# Patient Record
Sex: Female | Born: 1960 | Race: White | Hispanic: No | Smoking: Current some day smoker
Health system: Southern US, Community
[De-identification: ages and names within clinical notes are randomized; demographics above are authoritative.]

## PROBLEM LIST (undated history)

## (undated) DIAGNOSIS — T7840XA Allergy, unspecified, initial encounter: Secondary | ICD-10-CM

## (undated) DIAGNOSIS — E119 Type 2 diabetes mellitus without complications: Secondary | ICD-10-CM

## (undated) DIAGNOSIS — I10 Essential (primary) hypertension: Secondary | ICD-10-CM

## (undated) DIAGNOSIS — M1990 Unspecified osteoarthritis, unspecified site: Secondary | ICD-10-CM

## (undated) DIAGNOSIS — E785 Hyperlipidemia, unspecified: Secondary | ICD-10-CM

## (undated) HISTORY — DX: Allergy, unspecified, initial encounter: T78.40XA

## (undated) HISTORY — DX: Essential (primary) hypertension: I10

## (undated) HISTORY — DX: Unspecified osteoarthritis, unspecified site: M19.90

## (undated) HISTORY — DX: Type 2 diabetes mellitus without complications: E11.9

## (undated) HISTORY — PX: ABDOMINAL HYSTERECTOMY: SHX81

## (undated) HISTORY — DX: Hyperlipidemia, unspecified: E78.5

---

## 2017-03-04 LAB — HM DIABETES EYE EXAM

## 2017-12-13 ENCOUNTER — Ambulatory Visit: Payer: BLUE CROSS/BLUE SHIELD | Admitting: Physician Assistant

## 2017-12-13 ENCOUNTER — Encounter: Payer: Self-pay | Admitting: Physician Assistant

## 2017-12-13 DIAGNOSIS — I10 Essential (primary) hypertension: Secondary | ICD-10-CM | POA: Diagnosis not present

## 2017-12-13 DIAGNOSIS — E1100 Type 2 diabetes mellitus with hyperosmolarity without nonketotic hyperglycemic-hyperosmolar coma (NKHHC): Secondary | ICD-10-CM

## 2017-12-13 DIAGNOSIS — I152 Hypertension secondary to endocrine disorders: Secondary | ICD-10-CM

## 2017-12-13 DIAGNOSIS — Z1159 Encounter for screening for other viral diseases: Secondary | ICD-10-CM | POA: Diagnosis not present

## 2017-12-13 DIAGNOSIS — E1159 Type 2 diabetes mellitus with other circulatory complications: Secondary | ICD-10-CM

## 2017-12-13 DIAGNOSIS — Z130 Encounter for screening for diseases of the blood and blood-forming organs and certain disorders involving the immune mechanism: Secondary | ICD-10-CM | POA: Diagnosis not present

## 2017-12-13 DIAGNOSIS — Z114 Encounter for screening for human immunodeficiency virus [HIV]: Secondary | ICD-10-CM

## 2017-12-13 DIAGNOSIS — Z1329 Encounter for screening for other suspected endocrine disorder: Secondary | ICD-10-CM | POA: Diagnosis not present

## 2017-12-13 DIAGNOSIS — E785 Hyperlipidemia, unspecified: Secondary | ICD-10-CM

## 2017-12-13 MED ORDER — METFORMIN HCL 500 MG PO TABS
ORAL_TABLET | ORAL | 0 refills | Status: DC
Start: 2017-12-13 — End: 2018-02-01

## 2017-12-13 MED ORDER — GLYBURIDE 5 MG PO TABS: 5.0000 mg | ORAL_TABLET | Freq: Every day | ORAL | 0 refills | Status: DC

## 2017-12-13 MED ORDER — LISINOPRIL 10 MG PO TABS: 10.0000 mg | ORAL_TABLET | Freq: Every day | ORAL | 0 refills | Status: DC

## 2017-12-13 MED ORDER — AMLODIPINE BESYLATE 5 MG PO TABS: 5.0000 mg | ORAL_TABLET | Freq: Every day | ORAL | 0 refills | Status: DC

## 2017-12-13 NOTE — Progress Notes (Signed)
Patient: Stacie Fleming Female    DOB: 17-Apr-1961   57 y.o.   MRN: [id number] Visit Date: 12/14/2017  Today's Provider: Trinna Post, PA-C   Chief Complaint  Patient presents with  . Establish Care  . Diabetes  . Hypertension  . Hyperlipidemia   Subjective:    Stacie Fleming is a 57 y/o woman presenting today to establish care. She is moving from [place], [place]. She says she has lived here previously but moved to [place] to be with her daughter after the birth of her grandchild. She is living in [place], has a farm with a horse, dogs, planning on chickens. 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  She has two daughters, ages 65 and 22. She had a child who died shortly after birth due to polycystic kidney disease and immature lungs. She has two grandchildren.  She smokes half a pack a day.  She is divorced and not currently in a relationship or sexually active. She reports she had a hysterectomy at age 73 for some abnormal bleeding. Her uterus, cervix, and left ovary were removed. There was no cancer found.   She currently smokes 0.5 packs daily. She quit previously when pregnant. She has a desire to quit. Allergic to adhesives on patches.  She has a history of DM II, HLD, and HTN. She reports she was on 1000 mg metformin BID and glyburide 5 mg QD with a meal. She has been out of these for three months. Her last eye exam was at [facility] in [place]. She will need foot exam and urine microalbumin since she has been off Lisinopril. She reports she has never had pneumonia vaccine.  She was also on 20 mg Lisinopril daily and unknown dose of amlodipine. Has been out of these for three months as well. Denies chest pain, blurred vision.   Additionally was on crestor 20 mg QD, hasn't taken in months.    Hyperlipidemia  Pertinent negatives include no chest pain or shortness of breath.  Hypertension  This is a chronic problem. The problem  has been gradually worsening since onset. The problem is uncontrolled (170-180/ 90-100's). Associated symptoms include headaches. Pertinent negatives include no anxiety, blurred vision, chest pain, malaise/fatigue, neck pain, orthopnea, palpitations, peripheral edema, PND, shortness of breath or sweats. There are no associated agents to hypertension.  Diabetes  She presents for her follow-up diabetic visit. She has type 2 diabetes mellitus. Hypoglycemia symptoms include headaches. Pertinent negatives for hypoglycemia include no dizziness or sweats. Pertinent negatives for diabetes include no blurred vision and no chest pain. Current diabetic treatment includes oral agent (dual therapy). She is compliant with treatment none of the time. She is following a diabetic diet. She never participates in exercise. Her home blood glucose trend is increasing steadily (Since running out of medication. ). (Fasting: 130's  Not-fasting: High 100's - low 200's ) An ACE inhibitor/angiotensin II receptor blocker is not being taken. She does not see a podiatrist.Eye exam is current (Last eye exam 02/2017).       Allergies not on file   Current Outpatient Medications:  .  rosuvastatin (CRESTOR) 20 MG tablet, Take 1 tablet (20 mg total) by mouth daily., Disp: 90 tablet, Rfl: 0 .  amLODipine (NORVASC) 5 MG tablet, Take 1 tablet (5 mg total) by mouth daily., Disp: 90 tablet, Rfl: 0 .  glyBURIDE (DIABETA) 5 MG tablet, Take 1 tablet (5  mg total) by mouth daily with breakfast., Disp: 90 tablet, Rfl: 0 .  lisinopril (PRINIVIL,ZESTRIL) 10 MG tablet, Take 1 tablet (10 mg total) by mouth daily., Disp: 90 tablet, Rfl: 0 .  metFORMIN (GLUCOPHAGE) 500 MG tablet, Take 500 mg qd week 1. 500 mg BID week 2. 1000 mg Qam and 500 mg QHS week 3. 1000 mg BID week 4., Disp: 180 tablet, Rfl: 0  Review of Systems  Constitutional: Negative for malaise/fatigue.  HENT: Positive for postnasal drip.   Eyes: Negative for blurred vision.    Respiratory: Positive for cough. Negative for apnea, choking, chest tightness, shortness of breath, wheezing and stridor.   Cardiovascular: Negative for chest pain, palpitations, orthopnea and PND.  Gastrointestinal: Negative.   Musculoskeletal: Negative for neck pain.  Neurological: Positive for headaches. Negative for dizziness and light-headedness.    Social History   Tobacco Use  . Smoking status: Current Every Day Smoker    Packs/day: 0.50    Years: 40.00    Pack years: 20.00    Types: Cigarettes  . Smokeless tobacco: Never Used  Substance Use Topics  . Alcohol use: No    Frequency: Never   Objective:   There were no vitals taken for this visit. There were no vitals filed for this visit.   Physical Exam  Constitutional: She is oriented to person, place, and time. She appears well-developed and well-nourished.  Cardiovascular: Normal rate and regular rhythm.  Pulmonary/Chest: Effort normal and breath sounds normal.  Abdominal: Soft. Bowel sounds are normal.  Neurological: She is alert and oriented to person, place, and time.  Skin: Skin is warm and dry.  Psychiatric: She has a normal mood and affect. Her behavior is normal.        Assessment & Plan:     1. Type 2 diabetes mellitus with hyperosmolarity without coma, unspecified whether long term insulin use (HCC)  Uncontrolled, A1c today is 10.3. Clinical microalbuminuria present. She should restart her medications as instructed and should check her fasting blood sugars at least 4 days per week so I can trend this. Will request previous PCP records and eye exam. Have referred her locally to [facility] for yearly diabetic eye exam. She will need to follow up in one month to check sugars and also for physical exam/maintenance care.  - Urine Microalbumin w/creat. ratio - glyBURIDE (DIABETA) 5 MG tablet; Take 1 tablet (5 mg total) by mouth daily with breakfast.  Dispense: 90 tablet; Refill: 0 - metFORMIN (GLUCOPHAGE)  500 MG tablet; Take 500 mg qd week 1. 500 mg BID week 2. 1000 mg Qam and 500 mg QHS week 3. 1000 mg BID week 4.  Dispense: 180 tablet; Refill: 0 - Comprehensive Metabolic Panel (CMET) - Lipid Profile - Ambulatory referral to Ophthalmology - POCT HgB A1C  2. Hyperlipidemia, unspecified hyperlipidemia type  - rosuvastatin (CRESTOR) 20 MG tablet; Take 1 tablet (20 mg total) by mouth daily.  Dispense: 90 tablet; Refill: 0  3. Hypertension associated with diabetes ([place])  - lisinopril (PRINIVIL,ZESTRIL) 10 MG tablet; Take 1 tablet (10 mg total) by mouth daily.  Dispense: 90 tablet; Refill: 0 - amLODipine (NORVASC) 5 MG tablet; Take 1 tablet (5 mg total) by mouth daily.  Dispense: 90 tablet; Refill: 0  4. Screening for deficiency anemia  - CBC With Differential  5. Thyroid disorder screening - TSH  6. Encounter for screening for HIV  - HIV antibody (with reflex)  7. Encounter for hepatitis C screening test for low risk  patient - Hepatitis c antibody (reflex)  Return in about 1 month (around 01/10/2018) for CPE and chronic.  The entirety of the information documented in the History of Present Illness, Review of Systems and Physical Exam were personally obtained by me. Portions of this information were initially documented by Ashley Royalty, CMA and reviewed by me for thoroughness and accuracy.         Trinna Post, PA-C  [facility]

## 2017-12-13 NOTE — Patient Instructions (Signed)
Start with 500 mg once daily for one week. Add a 500 mg pill each week: Week 1: 500 mg QD Week 2: 500 mg BID Week 3: 1000 mg QAM and 500 mgs QHS Week 4: 1000 mg BID metformin  On week 2, add back 5 mg glyburide  HYPERTENSION Lisinopril 10 mg daily x 1 week  20 mg daily starting week 2 Week 2, add back 5 mg Norvasc  Check fasting Sugars: at least 4 days a week      Diabetes Mellitus and Exercise Exercising regularly is important for your overall health, especially when you have diabetes (diabetes mellitus). Exercising is not only about losing weight. It has many health benefits, such as increasing muscle strength and bone density and reducing body fat and stress. This leads to improved fitness, flexibility, and endurance, all of which result in better overall health. Exercise has additional benefits for people with diabetes, including:  Reducing appetite.  Helping to lower and control blood glucose.  Lowering blood pressure.  Helping to control amounts of fatty substances (lipids) in the blood, such as cholesterol and triglycerides.  Helping the body to respond better to insulin (improving insulin sensitivity).  Reducing how much insulin the body needs.  Decreasing the risk for heart disease by: ? Lowering cholesterol and triglyceride levels. ? Increasing the levels of good cholesterol. ? Lowering blood glucose levels.  What is my activity plan? Your health care provider or certified diabetes educator can help you make a plan for the type and frequency of exercise (activity plan) that works for you. Make sure that you:  Do at least 150 minutes of moderate-intensity or vigorous-intensity exercise each week. This could be brisk walking, biking, or water aerobics. ? Do stretching and strength exercises, such as yoga or weightlifting, at least 2 times a week. ? Spread out your activity over at least 3 days of the week.  Get some form of physical activity every day. ? Do not  go more than 2 days in a row without some kind of physical activity. ? Avoid being inactive for more than 90 minutes at a time. Take frequent breaks to walk or stretch.  Choose a type of exercise or activity that you enjoy, and set realistic goals.  Start slowly, and gradually increase the intensity of your exercise over time.  What do I need to know about managing my diabetes?  Check your blood glucose before and after exercising. ? If your blood glucose is higher than 240 mg/dL (13.3 mmol/L) before you exercise, check your urine for ketones. If you have ketones in your urine, do not exercise until your blood glucose returns to normal.  Know the symptoms of low blood glucose (hypoglycemia) and how to treat it. Your risk for hypoglycemia increases during and after exercise. Common symptoms of hypoglycemia can include: ? Hunger. ? Anxiety. ? Sweating and feeling clammy. ? Confusion. ? Dizziness or feeling light-headed. ? Increased heart rate or palpitations. ? Blurry vision. ? Tingling or numbness around the mouth, lips, or tongue. ? Tremors or shakes. ? Irritability.  Keep a rapid-acting carbohydrate snack available before, during, and after exercise to help prevent or treat hypoglycemia.  Avoid injecting insulin into areas of the body that are going to be exercised. For example, avoid injecting insulin into: ? The arms, when playing tennis. ? The legs, when jogging.  Keep records of your exercise habits. Doing this can help you and your health care provider adjust your diabetes management plan as needed.  Write down: ? Food that you eat before and after you exercise. ? Blood glucose levels before and after you exercise. ? The type and amount of exercise you have done. ? When your insulin is expected to peak, if you use insulin. Avoid exercising at times when your insulin is peaking.  When you start a new exercise or activity, work with your health care provider to make sure the  activity is safe for you, and to adjust your insulin, medicines, or food intake as needed.  Drink plenty of water while you exercise to prevent dehydration or heat stroke. Drink enough fluid to keep your urine clear or pale yellow. This information is not intended to replace advice given to you by your health care provider. Make sure you discuss any questions you have with your health care provider. Document Released: 12/26/2003 Document Revised: 04/24/2016 Document Reviewed: 03/16/2016 Elsevier Interactive Patient Education  2018 [employer].

## 2017-12-14 ENCOUNTER — Telehealth: Payer: Self-pay

## 2017-12-14 DIAGNOSIS — E1100 Type 2 diabetes mellitus with hyperosmolarity without nonketotic hyperglycemic-hyperosmolar coma (NKHHC): Secondary | ICD-10-CM | POA: Insufficient documentation

## 2017-12-14 DIAGNOSIS — I152 Hypertension secondary to endocrine disorders: Secondary | ICD-10-CM | POA: Insufficient documentation

## 2017-12-14 DIAGNOSIS — E785 Hyperlipidemia, unspecified: Secondary | ICD-10-CM | POA: Insufficient documentation

## 2017-12-14 DIAGNOSIS — E1159 Type 2 diabetes mellitus with other circulatory complications: Secondary | ICD-10-CM | POA: Insufficient documentation

## 2017-12-14 DIAGNOSIS — I10 Essential (primary) hypertension: Secondary | ICD-10-CM

## 2017-12-14 LAB — CBC WITH DIFFERENTIAL
Basophils Absolute: 0 10*3/uL (ref 0.0–0.2)
Basos: 0 %
EOS (ABSOLUTE): 0.4 10*3/uL (ref 0.0–0.4)
Eos: 3 %
Hematocrit: 47.8 % — ABNORMAL HIGH (ref 34.0–46.6)
Hemoglobin: 16.3 g/dL — ABNORMAL HIGH (ref 11.1–15.9)
Immature Grans (Abs): 0 10*3/uL (ref 0.0–0.1)
Immature Granulocytes: 0 %
Lymphocytes Absolute: 4 10*3/uL — ABNORMAL HIGH (ref 0.7–3.1)
Lymphs: 33 %
MCH: 29.8 pg (ref 26.6–33.0)
MCHC: 34.1 g/dL (ref 31.5–35.7)
MCV: 87 fL (ref 79–97)
Monocytes Absolute: 0.6 10*3/uL (ref 0.1–0.9)
Monocytes: 5 %
Neutrophils Absolute: 7.1 10*3/uL — ABNORMAL HIGH (ref 1.4–7.0)
Neutrophils: 59 %
RBC: 5.47 x10E6/uL — ABNORMAL HIGH (ref 3.77–5.28)
RDW: 13.4 % (ref 12.3–15.4)
WBC: 12.2 10*3/uL — ABNORMAL HIGH (ref 3.4–10.8)

## 2017-12-14 LAB — COMPREHENSIVE METABOLIC PANEL
ALT: 22 IU/L (ref 0–32)
AST: 16 IU/L (ref 0–40)
Albumin/Globulin Ratio: 1.6 (ref 1.2–2.2)
Albumin: 4.6 g/dL (ref 3.5–5.5)
Alkaline Phosphatase: 126 IU/L — ABNORMAL HIGH (ref 39–117)
BUN/Creatinine Ratio: 21 (ref 9–23)
BUN: 16 mg/dL (ref 6–24)
Bilirubin Total: 0.3 mg/dL (ref 0.0–1.2)
CO2: 21 mmol/L (ref 20–29)
Calcium: 10 mg/dL (ref 8.7–10.2)
Chloride: 97 mmol/L (ref 96–106)
Creatinine, Ser: 0.75 mg/dL (ref 0.57–1.00)
GFR calc Af Amer: 102 mL/min/{1.73_m2} (ref >59)
GFR calc non Af Amer: 89 mL/min/{1.73_m2} (ref >59)
Globulin, Total: 2.9 g/dL (ref 1.5–4.5)
Glucose: 298 mg/dL — ABNORMAL HIGH (ref 65–99)
Potassium: 4.4 mmol/L (ref 3.5–5.2)
Sodium: 134 mmol/L (ref 134–144)
Total Protein: 7.5 g/dL (ref 6.0–8.5)

## 2017-12-14 LAB — MICROALBUMIN / CREATININE URINE RATIO
Creatinine, Urine: 67.2 mg/dL
Microalb/Creat Ratio: 349.3 mg/g creat — ABNORMAL HIGH (ref 0.0–30.0)
Microalbumin, Urine: 234.7 ug/mL

## 2017-12-14 LAB — LIPID PANEL
Chol/HDL Ratio: 7.6 ratio — ABNORMAL HIGH (ref 0.0–4.4)
Cholesterol, Total: 267 mg/dL — ABNORMAL HIGH (ref 100–199)
HDL: 35 mg/dL — ABNORMAL LOW (ref >39)
Triglycerides: 563 mg/dL (ref 0–149)

## 2017-12-14 LAB — HCV COMMENT:

## 2017-12-14 LAB — TSH: TSH: 1.05 u[IU]/mL (ref 0.450–4.500)

## 2017-12-14 LAB — HIV ANTIBODY (ROUTINE TESTING W REFLEX): HIV Screen 4th Generation wRfx: NONREACTIVE

## 2017-12-14 LAB — HEPATITIS C ANTIBODY (REFLEX): HCV Ab: <0.1 s/co ratio (ref 0.0–0.9)

## 2017-12-14 LAB — POCT GLYCOSYLATED HEMOGLOBIN (HGB A1C): Hemoglobin A1C: 10.3

## 2017-12-14 MED ORDER — ROSUVASTATIN CALCIUM 20 MG PO TABS: 20.0000 mg | ORAL_TABLET | Freq: Every day | ORAL | 0 refills | Status: DC

## 2017-12-14 NOTE — Telephone Encounter (Signed)
-----   Message from Trinna Post, [place] sent at 12/14/2017  9:53 AM EST ----- Sugar high, as we expected since she's been out of her medications. Otherwise CMET normal. Cholesterol very high, LDL unable to be calculated. I will send in 20 mg crestor for her to resume nightly and we can recheck at her follow up. CBC with elevated white count, which patient has told me she has had for a while. Hemoglobin elevated, likely due to smoking. TSH normal. HIV normal. Hep C nonreactive.

## 2017-12-14 NOTE — Telephone Encounter (Signed)
Pt advised.   Thanks,   -Saraih Lorton  

## 2018-01-11 ENCOUNTER — Encounter: Payer: Self-pay | Admitting: Physician Assistant

## 2018-01-11 ENCOUNTER — Ambulatory Visit (INDEPENDENT_AMBULATORY_CARE_PROVIDER_SITE_OTHER): Payer: BLUE CROSS/BLUE SHIELD | Admitting: Physician Assistant

## 2018-01-11 VITALS — BP 144/94 | HR 80 | Temp 98.0°F | Resp 16 | Ht 65.0 in | Wt 186.0 lb

## 2018-01-11 DIAGNOSIS — E1159 Type 2 diabetes mellitus with other circulatory complications: Secondary | ICD-10-CM | POA: Diagnosis not present

## 2018-01-11 DIAGNOSIS — Z1211 Encounter for screening for malignant neoplasm of colon: Secondary | ICD-10-CM

## 2018-01-11 DIAGNOSIS — Z0000 Encounter for general adult medical examination without abnormal findings: Secondary | ICD-10-CM | POA: Diagnosis not present

## 2018-01-11 DIAGNOSIS — Z23 Encounter for immunization: Secondary | ICD-10-CM

## 2018-01-11 DIAGNOSIS — I10 Essential (primary) hypertension: Secondary | ICD-10-CM | POA: Diagnosis not present

## 2018-01-11 DIAGNOSIS — E1100 Type 2 diabetes mellitus with hyperosmolarity without nonketotic hyperglycemic-hyperosmolar coma (NKHHC): Secondary | ICD-10-CM | POA: Diagnosis not present

## 2018-01-11 DIAGNOSIS — I152 Hypertension secondary to endocrine disorders: Secondary | ICD-10-CM

## 2018-01-11 DIAGNOSIS — Z1239 Encounter for other screening for malignant neoplasm of breast: Secondary | ICD-10-CM

## 2018-01-11 MED ORDER — AMLODIPINE BESYLATE 10 MG PO TABS: 10.0000 mg | ORAL_TABLET | Freq: Every day | ORAL | 3 refills | Status: DC

## 2018-01-11 MED ORDER — ATORVASTATIN CALCIUM 10 MG PO TABS: 10.0000 mg | ORAL_TABLET | Freq: Every day | ORAL | 0 refills | Status: DC

## 2018-01-11 NOTE — Patient Instructions (Signed)

## 2018-01-11 NOTE — Progress Notes (Signed)
Patient: Stacie Fleming, Female    DOB: 14-Apr-1961, 57 y.o.   MRN: [id number] Visit Date: 01/11/2018  Today's Provider: Trinna Post, PA-C   Chief Complaint  Patient presents with  . Annual Exam   Subjective:    Annual physical exam Stacie Fleming is a 57 y.o. female who presents today for health maintenance and complete physical. She feels well. She reports exercising regularly. She reports she is sleeping well.  She was seen last visit as an establish care. She has chronic health issues including DM II, HTN, and HLD. Her A1C was 10.3. She was restarted on her diabetes medications including metformin and glyburide. Her fasting sugars have been running in the 100's most recently. She was also restarted on her Lisinopril 10 mg and amlodipine 5 mg. Crestor was sent in but she said it was too expensive for her insurance so she did not start this.  She cannot remember the last time she had a mammogram.  She had a hysterectomy remotely for non cancerous reasons and reports she does not have a cervix.   She has never had a colonoscopy. She will not get a colonoscopy. She does not have a family history of colon cancer. She is agreeable to the cologuard.  She is due for tetanus shot and pneumonia 23 today.  She has an upcoming eye exam scheduled.  -----------------------------------------------------------------   Review of Systems  Constitutional: Negative.   HENT: Negative.   Eyes: Negative.   Respiratory: Negative.   Cardiovascular: Negative.   Gastrointestinal: Negative.   Endocrine: Negative.   Genitourinary: Negative.   Musculoskeletal: Negative.   Skin: Negative.   Allergic/Immunologic: Negative.   Neurological: Negative.   Hematological: Negative.   Psychiatric/Behavioral: Negative.     Social History      She  reports that she has been smoking cigarettes.  She has a 20.00 pack-year smoking history. She has never used smokeless tobacco. She reports that  she does not drink alcohol or use drugs.       Social History   Socioeconomic History  . Marital status: Divorced    Spouse name: Not on file  . Number of children: Not on file  . Years of education: Not on file  . Highest education level: Not on file  Occupational History  . Not on file  Social Needs  . Financial resource strain: Not on file  . Food insecurity:    Worry: Not on file    Inability: Not on file  . Transportation needs:    Medical: Not on file    Non-medical: Not on file  Tobacco Use  . Smoking status: Current Every Day Smoker    Packs/day: 0.50    Years: 40.00    Pack years: 20.00    Types: Cigarettes  . Smokeless tobacco: Never Used  Substance and Sexual Activity  . Alcohol use: No    Frequency: Never  . Drug use: No  . Sexual activity: Not on file  Lifestyle  . Physical activity:    Days per week: Not on file    Minutes per session: Not on file  . Stress: Not on file  Relationships  . Social connections:    Talks on phone: Not on file    Gets together: Not on file    Attends religious service: Not on file    Active member of club or organization: Not on file    Attends meetings of clubs or organizations:  Not on file    Relationship status: Not on file  Other Topics Concern  . Not on file  Social History Narrative  . Not on file    History reviewed. No pertinent past medical history.   Patient Active Problem List   Diagnosis Date Noted  . Type 2 diabetes mellitus with hyperosmolarity without coma ([place]) 12/14/2017  . Hyperlipidemia 12/14/2017  . Hypertension associated with diabetes ([place]) 12/14/2017    Past Surgical History:  Procedure Laterality Date  . ABDOMINAL HYSTERECTOMY    . CESAREAN SECTION      Family History        Family Status  Relation Name Status  . Mother  Deceased  . Father  Deceased  . Sister  Deceased        Her family history includes Diabetes in her father, mother, and sister; Heart attack in her father and  mother; Hyperlipidemia in her father and mother; Hypothyroidism in her mother; Kidney disease in her sister.      Allergies  Allergen Reactions  . Neosporin [Neomycin-Bacitracin Zn-Polymyx]      Current Outpatient Medications:  .  amLODipine (NORVASC) 5 MG tablet, Take 1 tablet (5 mg total) by mouth daily., Disp: 90 tablet, Rfl: 0 .  glyBURIDE (DIABETA) 5 MG tablet, Take 1 tablet (5 mg total) by mouth daily with breakfast., Disp: 90 tablet, Rfl: 0 .  lisinopril (PRINIVIL,ZESTRIL) 10 MG tablet, Take 1 tablet (10 mg total) by mouth daily., Disp: 90 tablet, Rfl: 0 .  metFORMIN (GLUCOPHAGE) 500 MG tablet, Take 500 mg qd week 1. 500 mg BID week 2. 1000 mg Qam and 500 mg QHS week 3. 1000 mg BID week 4., Disp: 180 tablet, Rfl: 0 .  rosuvastatin (CRESTOR) 20 MG tablet, Take 1 tablet (20 mg total) by mouth daily. (Patient not taking: Reported on 01/11/2018), Disp: 90 tablet, Rfl: 0   Patient Care Team: Paulene Floor as PCP - General (Physician Assistant)      Objective:   Vitals: BP (!) 144/94 (BP Location: Right Arm, Patient Position: Sitting, Cuff Size: Normal)   Pulse 80   Temp 98 F (36.7 C) (Oral)   Resp 16   Ht 5\' 5"  (1.651 m)   Wt 186 lb (84.4 kg)   BMI 30.95 kg/m    Vitals:   01/11/18 0809  BP: (!) 144/94  Pulse: 80  Resp: 16  Temp: 98 F (36.7 C)  TempSrc: Oral  Weight: 186 lb (84.4 kg)  Height: 5\' 5"  (1.651 m)     Physical Exam  Constitutional: She is oriented to person, place, and time. She appears well-developed and well-nourished.  Cardiovascular: Normal rate and regular rhythm.  Pulmonary/Chest: Effort normal and breath sounds normal. Right breast exhibits no inverted nipple, no mass, no nipple discharge, no skin change and no tenderness. Left breast exhibits no inverted nipple, no mass, no nipple discharge, no skin change and no tenderness. Breasts are symmetrical.  Neurological: She is alert and oriented to person, place, and time.  Skin: Skin is warm  and dry.  Psychiatric: She has a normal mood and affect. Her behavior is normal.     Depression Screen PHQ 2/9 Scores 12/13/2017  PHQ - 2 Score 0  PHQ- 9 Score 0   Diabetic Foot Exam - Simple   Simple Foot Form Diabetic Foot exam was performed with the following findings:  Yes 01/11/2018  8:30 AM  Visual Inspection No deformities, no ulcerations, no other skin breakdown bilaterally:  Yes Sensation Testing Intact to touch and monofilament testing bilaterally:  Yes Pulse Check Comments       Assessment & Plan:     Routine Health Maintenance and Physical Exam  Exercise Activities and Dietary recommendations Goals    None       There is no immunization history on file for this patient.  Health Maintenance  Topic Date Due  . PNEUMOCOCCAL POLYSACCHARIDE VACCINE (1) 10/23/1962  . FOOT EXAM  10/23/1970  . TETANUS/TDAP  10/24/1979  . MAMMOGRAM  10/23/2010  . COLONOSCOPY  10/23/2010  . INFLUENZA VACCINE  05/19/2018 (Originally 05/19/2017)  . OPHTHALMOLOGY EXAM  03/04/2018  . HEMOGLOBIN A1C  06/13/2018  . Hepatitis C Screening  Completed  . HIV Screening  Completed     Discussed health benefits of physical activity, and encouraged her to engage in regular exercise appropriate for her age and condition.    1. Annual physical exam   2. Breast cancer screening  - MM Digital Screening; Future  3. Hypertension associated with diabetes ([place])  Increased from 5 mg daily to 10 mg daily 2/2 elevated BP today.   - amLODipine (NORVASC) 10 MG tablet; Take 1 tablet (10 mg total) by mouth daily.  Dispense: 90 tablet; Refill: 3  4. Colon cancer screening  - Cologuard  5. Type 2 diabetes mellitus with hyperosmolarity without coma, without long-term current use of insulin (HCC)  Will see her back in 3 months to check A1c.  - atorvastatin (LIPITOR) 10 MG tablet; Take 1 tablet (10 mg total) by mouth daily.  Dispense: 90 tablet; Refill: 0 - Pneumococcal polysaccharide vaccine  23-valent greater than or equal to 2yo subcutaneous/IM  6. Need for Tdap vaccination  - Tdap vaccine greater than or equal to 57yo IM  Return in about 3 months (around 04/13/2018) for DM, HTN, HLD.  The entirety of the information documented in the History of Present Illness, Review of Systems and Physical Exam were personally obtained by me. Portions of this information were initially documented by Ashley Royalty, CMA and reviewed by me for thoroughness and accuracy.   --------------------------------------------------------------------    Trinna Post, PA-C  [facility]

## 2018-01-12 ENCOUNTER — Telehealth: Payer: Self-pay | Admitting: Physician Assistant

## 2018-01-12 NOTE — Telephone Encounter (Signed)
Order for cologuard faxed to [facility] °

## 2018-02-01 ENCOUNTER — Other Ambulatory Visit: Payer: Self-pay | Admitting: Physician Assistant

## 2018-02-01 DIAGNOSIS — E1100 Type 2 diabetes mellitus with hyperosmolarity without nonketotic hyperglycemic-hyperosmolar coma (NKHHC): Secondary | ICD-10-CM

## 2018-02-01 DIAGNOSIS — E1159 Type 2 diabetes mellitus with other circulatory complications: Secondary | ICD-10-CM

## 2018-02-01 DIAGNOSIS — I152 Hypertension secondary to endocrine disorders: Secondary | ICD-10-CM

## 2018-02-01 DIAGNOSIS — I10 Essential (primary) hypertension: Secondary | ICD-10-CM

## 2018-02-01 MED ORDER — LISINOPRIL 10 MG PO TABS: 10.0000 mg | ORAL_TABLET | Freq: Every day | ORAL | 0 refills | Status: DC

## 2018-02-01 MED ORDER — METFORMIN HCL 1000 MG PO TABS: 1000.0000 mg | ORAL_TABLET | Freq: Two times a day (BID) | ORAL | 0 refills | Status: DC

## 2018-02-01 NOTE — Telephone Encounter (Signed)
Pt contacted office for refill request on the following medications:  1. metFORMIN (GLUCOPHAGE) 500 MG tablet  2. lisinopril (PRINIVIL,ZESTRIL) 10 MG tablet   90 day supply  [facility] [address]  Last Rx: 12/13/17 90 day supply LOV: 01/11/18 Please advise. Thanks TNP

## 2018-02-15 DIAGNOSIS — Z1211 Encounter for screening for malignant neoplasm of colon: Secondary | ICD-10-CM | POA: Diagnosis not present

## 2018-02-16 LAB — COLOGUARD: COLOGUARD: POSITIVE

## 2018-02-17 DIAGNOSIS — E119 Type 2 diabetes mellitus without complications: Secondary | ICD-10-CM | POA: Diagnosis not present

## 2018-02-17 LAB — HM DIABETES EYE EXAM

## 2018-02-18 ENCOUNTER — Encounter: Payer: Self-pay | Admitting: Physician Assistant

## 2018-02-28 ENCOUNTER — Telehealth: Payer: Self-pay | Admitting: Physician Assistant

## 2018-02-28 DIAGNOSIS — R195 Other fecal abnormalities: Secondary | ICD-10-CM | POA: Insufficient documentation

## 2018-02-28 NOTE — Telephone Encounter (Signed)
This patient has had a positive cologuard. She will need a colonoscopy to follow up. Please let me know if she prefers a particular GI doctor, if not, I will refer within [facility].

## 2018-03-01 ENCOUNTER — Other Ambulatory Visit: Payer: Self-pay | Admitting: Physician Assistant

## 2018-03-01 DIAGNOSIS — R195 Other fecal abnormalities: Secondary | ICD-10-CM

## 2018-03-01 NOTE — Telephone Encounter (Signed)
Referral to GI has been placed. 

## 2018-03-01 NOTE — Progress Notes (Signed)
Referral to GI placed for positive Cologuard.

## 2018-03-01 NOTE — Telephone Encounter (Signed)
Patient advised she has no G.I specialist in mind, please refer. KW

## 2018-03-17 ENCOUNTER — Encounter: Payer: Self-pay | Admitting: Gastroenterology

## 2018-04-13 ENCOUNTER — Ambulatory Visit: Payer: BLUE CROSS/BLUE SHIELD | Admitting: Physician Assistant

## 2018-04-13 ENCOUNTER — Encounter: Payer: Self-pay | Admitting: Physician Assistant

## 2018-04-13 VITALS — BP 122/76 | HR 76 | Temp 98.4°F | Resp 16 | Wt 182.0 lb

## 2018-04-13 DIAGNOSIS — E1159 Type 2 diabetes mellitus with other circulatory complications: Secondary | ICD-10-CM | POA: Diagnosis not present

## 2018-04-13 DIAGNOSIS — I10 Essential (primary) hypertension: Secondary | ICD-10-CM

## 2018-04-13 DIAGNOSIS — E1100 Type 2 diabetes mellitus with hyperosmolarity without nonketotic hyperglycemic-hyperosmolar coma (NKHHC): Secondary | ICD-10-CM

## 2018-04-13 DIAGNOSIS — E785 Hyperlipidemia, unspecified: Secondary | ICD-10-CM

## 2018-04-13 DIAGNOSIS — I152 Hypertension secondary to endocrine disorders: Secondary | ICD-10-CM

## 2018-04-13 LAB — POCT GLYCOSYLATED HEMOGLOBIN (HGB A1C): Hemoglobin A1C: 7.2 % — AB (ref 4.0–5.6)

## 2018-04-13 MED ORDER — GLYBURIDE 5 MG PO TABS: ORAL_TABLET | ORAL | 0 refills | Status: DC

## 2018-04-13 NOTE — Patient Instructions (Signed)

## 2018-04-13 NOTE — Progress Notes (Signed)
Patient: Stacie Fleming Female    DOB: [birth year]   57 y.o.   MRN: [id number] Visit Date: 04/13/2018  Today's Provider: Trinna Post, PA-C   Chief Complaint  Patient presents with  . Diabetes  . Hypertension  . Hyperlipidemia   Subjective:      Lab Results  Component Value Date   HGBA1C 7.2 (A) 04/13/2018    Diabetes  She presents for her follow-up diabetic visit. She has type 2 diabetes mellitus. Her disease course has been stable. There are no hypoglycemic associated symptoms. Pertinent negatives for hypoglycemia include no headaches or sweats. Pertinent negatives for diabetes include no blurred vision, no chest pain, no fatigue, no foot paresthesias, no foot ulcerations, no polydipsia, no polyphagia, no polyuria, no visual change, no weakness and no weight loss. Symptoms are stable. Current diabetic treatment includes oral agent (dual therapy). She is compliant with treatment all of the time. Her weight is stable. She is following a generally healthy diet. She participates in exercise three times a week. There is no change (Fasting blood sugars in the low 100's ) in her home blood glucose trend. She does not see a podiatrist.Eye exam is current.  Hypertension  The problem is controlled. Pertinent negatives include no anxiety, blurred vision, chest pain, headaches, malaise/fatigue, neck pain, orthopnea, palpitations, peripheral edema, PND, shortness of breath or sweats. There are no associated agents to hypertension. Risk factors for coronary artery disease include dyslipidemia and diabetes mellitus. Past treatments include ACE inhibitors and calcium channel blockers. There are no compliance problems.   Hyperlipidemia  This is a chronic problem. Pertinent negatives include no chest pain or shortness of breath. Current antihyperlipidemic treatment includes statins. There are no compliance problems.     Lab Results  Component Value Date   CHOL 267 (H) 12/13/2017   Lab  Results  Component Value Date   HDL 35 (L) 12/13/2017   Lab Results  Component Value Date   LDLCALC Comment 12/13/2017   Lab Results  Component Value Date   TRIG 563 ([place]) 12/13/2017   Lab Results  Component Value Date   CHOLHDL 7.6 (H) 12/13/2017   No results found for: LDLDIRECT   Allergies  Allergen Reactions  . Neosporin [Neomycin-Bacitracin Zn-Polymyx]      Current Outpatient Medications:  .  amLODipine (NORVASC) 10 MG tablet, Take 1 tablet (10 mg total) by mouth daily., Disp: 90 tablet, Rfl: 3 .  atorvastatin (LIPITOR) 10 MG tablet, Take 1 tablet (10 mg total) by mouth daily., Disp: 90 tablet, Rfl: 0 .  lisinopril (PRINIVIL,ZESTRIL) 10 MG tablet, Take 1 tablet (10 mg total) by mouth daily., Disp: 90 tablet, Rfl: 0 .  metFORMIN (GLUCOPHAGE) 1000 MG tablet, Take 1 tablet (1,000 mg total) by mouth 2 (two) times daily with a meal., Disp: 180 tablet, Rfl: 0 .  glyBURIDE (DIABETA) 5 MG tablet, Take 1 tablet (5 mg total) by mouth daily with breakfast., Disp: 90 tablet, Rfl: 0  Review of Systems  Constitutional: Negative for fatigue, malaise/fatigue and weight loss.  Eyes: Negative for blurred vision.  Respiratory: Negative for shortness of breath.   Cardiovascular: Negative for chest pain, palpitations, orthopnea and PND.  Endocrine: Negative for polydipsia, polyphagia and polyuria.  Musculoskeletal: Negative for neck pain.  Neurological: Negative for weakness and headaches.    Social History   Tobacco Use  . Smoking status: Current Every Day Smoker    Packs/day: 0.50    Years: 40.00  Pack years: 20.00    Types: Cigarettes  . Smokeless tobacco: Never Used  Substance Use Topics  . Alcohol use: No    Frequency: Never   Objective:   BP 122/76 (BP Location: Right Arm, Patient Position: Sitting, Cuff Size: Large)   Pulse 76   Temp 98.4 F (36.9 C) (Oral)   Resp 16   Wt 182 lb (82.6 kg)   BMI 30.29 kg/m  Vitals:   04/13/18 1010  BP: 122/76  Pulse: 76    Resp: 16  Temp: 98.4 F (36.9 C)  TempSrc: Oral  Weight: 182 lb (82.6 kg)     Physical Exam      Assessment & Plan:     1. Type 2 diabetes mellitus with hyperosmolarity without coma, unspecified whether long term insulin use (HCC)  A1c very much improved today, just shy of being controlled. Will increase glyburide to 5 mg in the morning and 2.5 mg at night. Patient reports she is renovating her house and she is eating poorly.  - POCT glycosylated hemoglobin (Hb A1C) - glyBURIDE (DIABETA) 5 MG tablet; Take 5 mg once in the morning and 2.5 mg once in the evening both with meals.  Dispense: 135 tablet; Refill: 0  2. Hyperlipidemia, unspecified hyperlipidemia type  Stable, continue current medications.   3. Hypertension associated with diabetes ([place])  Stable, continue current medications.  Return in about 3 months (around 07/14/2018) for DM.  The entirety of the information documented in the History of Present Illness, Review of Systems and Physical Exam were personally obtained by me. Portions of this information were initially documented by Ashley Royalty, CMA and reviewed by me for thoroughness and accuracy.          Trinna Post, PA-C  [facility]

## 2018-05-04 ENCOUNTER — Other Ambulatory Visit: Payer: Self-pay | Admitting: Physician Assistant

## 2018-05-04 DIAGNOSIS — I152 Hypertension secondary to endocrine disorders: Secondary | ICD-10-CM

## 2018-05-04 DIAGNOSIS — I10 Essential (primary) hypertension: Secondary | ICD-10-CM

## 2018-05-04 DIAGNOSIS — E1159 Type 2 diabetes mellitus with other circulatory complications: Secondary | ICD-10-CM

## 2018-05-04 DIAGNOSIS — E1100 Type 2 diabetes mellitus with hyperosmolarity without nonketotic hyperglycemic-hyperosmolar coma (NKHHC): Secondary | ICD-10-CM

## 2018-05-04 NOTE — Telephone Encounter (Signed)
Pt contacted office for refill request on the following medications:  1. metFORMIN (GLUCOPHAGE) 1000 MG tablet 2. lisinopril (PRINIVIL,ZESTRIL) 10 MG tablet 3. atorvastatin (LIPITOR) 10 MG tablet  [facility] [address]  LOV: 04/13/18 Please advise. Thanks TNP

## 2018-05-05 MED ORDER — LISINOPRIL 10 MG PO TABS: 10.0000 mg | ORAL_TABLET | Freq: Every day | ORAL | 0 refills | Status: DC

## 2018-05-05 MED ORDER — ATORVASTATIN CALCIUM 10 MG PO TABS: 10.0000 mg | ORAL_TABLET | Freq: Every day | ORAL | 0 refills | Status: DC

## 2018-05-05 MED ORDER — METFORMIN HCL 1000 MG PO TABS: 1000.0000 mg | ORAL_TABLET | Freq: Two times a day (BID) | ORAL | 0 refills | Status: DC

## 2018-05-10 ENCOUNTER — Ambulatory Visit (AMBULATORY_SURGERY_CENTER): Payer: Self-pay

## 2018-05-10 VITALS — Ht 65.0 in | Wt 182.4 lb

## 2018-05-10 DIAGNOSIS — Z1211 Encounter for screening for malignant neoplasm of colon: Secondary | ICD-10-CM

## 2018-05-10 MED ORDER — NA SULFATE-K SULFATE-MG SULF 17.5-3.13-1.6 GM/177ML PO SOLN: 1.0000 | Freq: Once | ORAL | 0 refills | Status: AC

## 2018-05-10 NOTE — Progress Notes (Signed)
Denies allergies to eggs or soy products. Denies complication of anesthesia or sedation. Denies use of weight loss medication. Denies use of O2.   Emmi instructions declined.  

## 2018-05-24 ENCOUNTER — Encounter: Payer: Self-pay | Admitting: Gastroenterology

## 2018-05-24 ENCOUNTER — Ambulatory Visit (AMBULATORY_SURGERY_CENTER): Payer: BLUE CROSS/BLUE SHIELD | Admitting: Gastroenterology

## 2018-05-24 VITALS — BP 150/81 | HR 67 | Temp 97.8°F | Resp 17 | Ht 65.0 in | Wt 182.0 lb

## 2018-05-24 DIAGNOSIS — Z1211 Encounter for screening for malignant neoplasm of colon: Secondary | ICD-10-CM | POA: Diagnosis not present

## 2018-05-24 DIAGNOSIS — D128 Benign neoplasm of rectum: Secondary | ICD-10-CM | POA: Diagnosis not present

## 2018-05-24 DIAGNOSIS — D129 Benign neoplasm of anus and anal canal: Secondary | ICD-10-CM

## 2018-05-24 DIAGNOSIS — K635 Polyp of colon: Secondary | ICD-10-CM | POA: Diagnosis not present

## 2018-05-24 DIAGNOSIS — D124 Benign neoplasm of descending colon: Secondary | ICD-10-CM | POA: Diagnosis not present

## 2018-05-24 DIAGNOSIS — D123 Benign neoplasm of transverse colon: Secondary | ICD-10-CM | POA: Diagnosis not present

## 2018-05-24 MED ORDER — SODIUM CHLORIDE 0.9 % IV SOLN: 500.0000 mL | Freq: Once | INTRAVENOUS | Status: DC

## 2018-05-24 NOTE — Progress Notes (Signed)
Pt's states no medical or surgical changes since previsit or office visit. 

## 2018-05-24 NOTE — Progress Notes (Signed)
Report to PACU, RN, vss, BBS= Clear.  

## 2018-05-24 NOTE — Progress Notes (Signed)
Called to room to assist during endoscopic procedure.  Patient ID and intended procedure confirmed with present staff. Received instructions for my participation in the procedure from the performing physician.  

## 2018-05-24 NOTE — Patient Instructions (Signed)
YOU HAD AN ENDOSCOPIC PROCEDURE TODAY AT [facility]:   Refer to the procedure report that was given to you for any specific questions about what was found during the examination.  If the procedure report does not answer your questions, please call your gastroenterologist to clarify.  If you requested that your care partner not be given the details of your procedure findings, then the procedure report has been included in a sealed envelope for you to review at your convenience later.  YOU SHOULD EXPECT: Some feelings of bloating in the abdomen. Passage of more gas than usual.  Walking can help get rid of the air that was put into your GI tract during the procedure and reduce the bloating. If you had a lower endoscopy (such as a colonoscopy or flexible sigmoidoscopy) you may notice spotting of blood in your stool or on the toilet paper. If you underwent a bowel prep for your procedure, you may not have a normal bowel movement for a few days.  Please Note:  You might notice some irritation and congestion in your nose or some drainage.  This is from the oxygen used during your procedure.  There is no need for concern and it should clear up in a day or so.  SYMPTOMS TO REPORT IMMEDIATELY:   Following lower endoscopy (colonoscopy or flexible sigmoidoscopy):  Excessive amounts of blood in the stool  Significant tenderness or worsening of abdominal pains  Swelling of the abdomen that is new, acute  Fever of 100F or higher  For urgent or emergent issues, a gastroenterologist can be reached at any hour by calling [phone].   DIET:  We do recommend a small meal at first, but then you may proceed to your regular diet.  Drink plenty of fluids but you should avoid alcoholic beverages for 24 hours.  ACTIVITY:  You should plan to take it easy for the rest of today and you should NOT DRIVE or use heavy machinery until tomorrow (because of the sedation medicines used during the test).     FOLLOW UP: Our staff will call the number listed on your records the next business day following your procedure to check on you and address any questions or concerns that you may have regarding the information given to you following your procedure. If we do not reach you, we will leave a message.  However, if you are feeling well and you are not experiencing any problems, there is no need to return our call.  We will assume that you have returned to your regular daily activities without incident.  If any biopsies were taken you will be contacted by phone or by letter within the next 1-3 weeks.  Please call us at [phone] if you have not heard about the biopsies in 3 weeks.    SIGNATURES/CONFIDENTIALITY: You and/or your care partner have signed paperwork which will be entered into your electronic medical record.  These signatures attest to the fact that that the information above on your After Visit Summary has been reviewed and is understood.  Full responsibility of the confidentiality of this discharge information lies with you and/or your care-partner.   Handouts were given to your care partner on polyps, diverticulosis, and hemorrhoids. NO ASPIRIN, ASPIRIN CONTAINING PRODUCTS (BC OR GOODY POWDERS) OR NSAIDS (IBUPROFEN, ADVIL, ALEVE, AND MOTRIN) FOR 2 weeks; TYLENOL IS OK TO TAKE. You may resume your other current medications today. Your blood sugar was 122 in the recovery room. Await biopsy  results. Please call if any questions or concerns.

## 2018-05-24 NOTE — Progress Notes (Signed)
No problems noted in the recovery room. maw 

## 2018-05-24 NOTE — Op Note (Signed)
[facility] Patient Name: Stacie Fleming Procedure Date: 05/24/2018 7:57 AM MRN: [id number] Endoscopist: Mauri Pole , MD Age: 57 Referring MD:  Date of Birth: 18-May-1961 Gender: Female Account #: [id number] Procedure:                Colonoscopy Indications:              Screening for colorectal malignant neoplasm Medicines:                Monitored Anesthesia Care Procedure:                Pre-Anesthesia Assessment:                           - Prior to the procedure, a History and Physical                            was performed, and patient medications and                            allergies were reviewed. The patient's tolerance of                            previous anesthesia was also reviewed. The risks                            and benefits of the procedure and the sedation                            options and risks were discussed with the patient.                            All questions were answered, and informed consent                            was obtained. Prior Anticoagulants: The patient has                            taken no previous anticoagulant or antiplatelet                            agents. ASA Grade Assessment: II - A patient with                            mild systemic disease. After reviewing the risks                            and benefits, the patient was deemed in                            satisfactory condition to undergo the procedure.                           After obtaining informed consent, the colonoscope  was passed under direct vision. Throughout the                            procedure, the patient's blood pressure, pulse, and                            oxygen saturations were monitored continuously. The                            Colonoscope was introduced through the anus and                            advanced to the the terminal ileum, with                            identification of the  appendiceal orifice and IC                            valve. The colonoscopy was performed without                            difficulty. The patient tolerated the procedure                            well. The quality of the bowel preparation was                            excellent. The terminal ileum, ileocecal valve,                            appendiceal orifice, and rectum were photographed. Scope In: 8:02:28 AM Scope Out: 8:21:40 AM Scope Withdrawal Time: 0 hours 16 minutes 41 seconds  Total Procedure Duration: 0 hours 19 minutes 12 seconds  Findings:                 The perianal and digital rectal examinations were                            normal.                           A 6 mm polyp was found in the transverse colon. The                            polyp was sessile. The polyp was removed with a                            cold snare. Resection and retrieval were complete.                           A 2 mm polyp was found in the descending colon. The                            polyp was sessile. The polyp  was removed with a                            cold biopsy forceps. Resection and retrieval were                            complete.                           Multiple small and large-mouthed diverticula were                            found in the sigmoid colon and descending colon.                           Two semi-pedunculated polyps were found in the                            rectum. The polyps were 9 to 14 mm in size. These                            polyps were removed with a hot snare. Resection and                            retrieval were complete.                           Non-bleeding internal hemorrhoids were found during                            retroflexion. The hemorrhoids were small. Complications:            No immediate complications. Estimated Blood Loss:     Estimated blood loss was minimal. Impression:               - One 6 mm polyp in the transverse  colon, removed                            with a cold snare. Resected and retrieved.                           - One 2 mm polyp in the descending colon, removed                            with a cold biopsy forceps. Resected and retrieved.                           - Diverticulosis in the sigmoid colon and in the                            descending colon.                           - Two 9 to 14 mm polyps in the rectum, removed with  a hot snare. Resected and retrieved.                           - Non-bleeding internal hemorrhoids. Recommendation:           - Patient has a contact number available for                            emergencies. The signs and symptoms of potential                            delayed complications were discussed with the                            patient. Return to normal activities tomorrow.                            Written discharge instructions were provided to the                            patient.                           - Resume previous diet.                           - Continue present medications.                           - No aspirin, ibuprofen, naproxen, or other                            non-steroidal anti-inflammatory drugs for 2 weeks.                           - Await pathology results.                           - Repeat colonoscopy in 3 years for surveillance                            based on pathology results. Mauri Pole, MD 05/24/2018 8:27:26 AM This report has been signed electronically.

## 2018-05-25 ENCOUNTER — Telehealth: Payer: Self-pay | Admitting: *Deleted

## 2018-05-25 NOTE — Telephone Encounter (Signed)
  Follow up Call-  Call back number 05/24/2018  Post procedure Call Back phone  # [phone]  Permission to leave phone message Yes     Patient questions:  Do you have a fever, pain , or abdominal swelling? No. Pain Score  0 *  Have you tolerated food without any problems? Yes.    Have you been able to return to your normal activities? Yes.    Do you have any questions about your discharge instructions: Diet   No. Medications  No. Follow up visit  Yes.    Do you have questions or concerns about your Care? No.  Actions: * If pain score is 4 or above: No action needed, pain <4.

## 2018-05-26 ENCOUNTER — Encounter: Payer: Self-pay | Admitting: Gastroenterology

## 2018-07-26 ENCOUNTER — Encounter: Payer: Self-pay | Admitting: Physician Assistant

## 2018-07-26 ENCOUNTER — Ambulatory Visit: Payer: BLUE CROSS/BLUE SHIELD | Admitting: Physician Assistant

## 2018-07-26 VITALS — BP 120/82 | HR 64 | Temp 97.8°F | Resp 16 | Ht 65.0 in | Wt 184.0 lb

## 2018-07-26 DIAGNOSIS — I10 Essential (primary) hypertension: Secondary | ICD-10-CM

## 2018-07-26 DIAGNOSIS — E785 Hyperlipidemia, unspecified: Secondary | ICD-10-CM | POA: Diagnosis not present

## 2018-07-26 DIAGNOSIS — E1100 Type 2 diabetes mellitus with hyperosmolarity without nonketotic hyperglycemic-hyperosmolar coma (NKHHC): Secondary | ICD-10-CM | POA: Diagnosis not present

## 2018-07-26 DIAGNOSIS — Z23 Encounter for immunization: Secondary | ICD-10-CM

## 2018-07-26 DIAGNOSIS — I152 Hypertension secondary to endocrine disorders: Secondary | ICD-10-CM

## 2018-07-26 DIAGNOSIS — E1159 Type 2 diabetes mellitus with other circulatory complications: Secondary | ICD-10-CM

## 2018-07-26 MED ORDER — AMLODIPINE BESYLATE 10 MG PO TABS: 10.0000 mg | ORAL_TABLET | Freq: Every day | ORAL | 0 refills | Status: DC

## 2018-07-26 MED ORDER — LISINOPRIL 10 MG PO TABS: 10.0000 mg | ORAL_TABLET | Freq: Every day | ORAL | 0 refills | Status: DC

## 2018-07-26 MED ORDER — GLYBURIDE 5 MG PO TABS: ORAL_TABLET | ORAL | 0 refills | Status: DC

## 2018-07-26 MED ORDER — METFORMIN HCL 1000 MG PO TABS: 1000.0000 mg | ORAL_TABLET | Freq: Two times a day (BID) | ORAL | 0 refills | Status: DC

## 2018-07-26 NOTE — Patient Instructions (Signed)

## 2018-07-26 NOTE — Progress Notes (Signed)
Patient: Stacie Fleming Female    DOB: 1961-07-26   57 y.o.   MRN: [id number] Visit Date: 07/27/2018  Today's Provider: Trinna Post, PA-C   Chief Complaint  Patient presents with  . Diabetes   Subjective:    HPI  Diabetes Mellitus Type II, Follow-up:   Lab Results  Component Value Date   HGBA1C 6.7 (H) 07/26/2018   HGBA1C 7.2 (A) 04/13/2018   HGBA1C 10.3 12/14/2017    Last seen for diabetes 3 months ago.  Management since then includes increase glyburide 5 mg in the morning and 5 mg in the evening. She reports excellent compliance with treatment. She is not having side effects.  Current symptoms include none and have been stable. Home blood sugar records: trend: stable  Episodes of hypoglycemia? no   Current Insulin Regimen:  Most Recent Eye Exam:  Weight trend: stable Prior visit with dietician: no Current diet: not asked Current exercise: housecleaning, no regular exercise and walking  HLD: Currently taking 10 mg Lipitor without issues. Denies chest pain, SOB. Was previously taking crestor 20 mg but this was unaffordable here.   Pertinent Labs:    Component Value Date/Time   CHOL 189 07/26/2018 1100   TRIG 445 (H) 07/26/2018 1100   HDL 34 (L) 07/26/2018 1100   LDLCALC Comment 07/26/2018 1100   CREATININE 0.76 07/26/2018 1100    Wt Readings from Last 3 Encounters:  07/26/18 184 lb (83.5 kg)  05/24/18 182 lb (82.6 kg)  05/10/18 182 lb 6.4 oz (82.7 kg)    ------------------------------------------------------------------------      Allergies  Allergen Reactions  . Neosporin [Neomycin-Bacitracin Zn-Polymyx]      Current Outpatient Medications:  .  amLODipine (NORVASC) 10 MG tablet, Take 1 tablet (10 mg total) by mouth daily., Disp: 90 tablet, Rfl: 0 .  atorvastatin (LIPITOR) 10 MG tablet, Take 1 tablet (10 mg total) by mouth daily., Disp: 90 tablet, Rfl: 0 .  glyBURIDE (DIABETA) 5 MG tablet, Take 5 mg once in the morning and 2.5 mg  once in the evening both with meals., Disp: 135 tablet, Rfl: 0 .  ibuprofen (ADVIL,MOTRIN) 200 MG tablet, Take 200 mg by mouth every 6 (six) hours as needed., Disp: , Rfl:  .  lisinopril (PRINIVIL,ZESTRIL) 10 MG tablet, Take 1 tablet (10 mg total) by mouth daily., Disp: 90 tablet, Rfl: 0 .  metFORMIN (GLUCOPHAGE) 1000 MG tablet, Take 1 tablet (1,000 mg total) by mouth 2 (two) times daily with a meal., Disp: 180 tablet, Rfl: 0  Review of Systems  Constitutional: Negative.   Eyes: Negative.   Respiratory: Negative.   Cardiovascular: Negative.   Endocrine: Negative.     Social History   Tobacco Use  . Smoking status: Current Every Day Smoker    Packs/day: 0.50    Years: 40.00    Pack years: 20.00    Types: Cigarettes  . Smokeless tobacco: Never Used  Substance Use Topics  . Alcohol use: No    Frequency: Never   Objective:   BP 120/82 (BP Location: Left Arm, Patient Position: Sitting, Cuff Size: Normal)   Pulse 64   Temp 97.8 F (36.6 C) (Oral)   Resp 16   Ht 5\' 5"  (1.651 m)   Wt 184 lb (83.5 kg)   SpO2 97%   BMI 30.62 kg/m  Vitals:   07/26/18 1005  BP: 120/82  Pulse: 64  Resp: 16  Temp: 97.8 F (36.6 C)  TempSrc: Oral  SpO2: 97%  Weight: 184 lb (83.5 kg)  Height: 5\' 5"  (1.651 m)     Physical Exam  Constitutional: She appears well-developed and well-nourished.  Cardiovascular: Normal rate and regular rhythm.  Pulmonary/Chest: Effort normal and breath sounds normal.  Skin: Skin is warm and dry.  Psychiatric: She has a normal mood and affect. Her behavior is normal.        Assessment & Plan:     1. Type 2 diabetes mellitus with hyperosmolarity without coma, unspecified whether long term insulin use ([place])  Adjust pending labwork.  - POCT glycosylated hemoglobin (Hb A1C) - CBC with Differential - metFORMIN (GLUCOPHAGE) 1000 MG tablet; Take 1 tablet (1,000 mg total) by mouth 2 (two) times daily with a meal.  Dispense: 180 tablet; Refill: 0 - glyBURIDE  (DIABETA) 5 MG tablet; Take 5 mg once in the morning and 2.5 mg once in the evening both with meals.  Dispense: 135 tablet; Refill: 0  2. Hyperlipidemia, unspecified hyperlipidemia type  Adjust pending labwork.   - Lipid Profile - HgB A1c  3. Hypertension associated with diabetes ([place])  Stable, continue present medications.  - Comprehensive Metabolic Panel (CMET) - CBC with Differential - amLODipine (NORVASC) 10 MG tablet; Take 1 tablet (10 mg total) by mouth daily.  Dispense: 90 tablet; Refill: 0 - lisinopril (PRINIVIL,ZESTRIL) 10 MG tablet; Take 1 tablet (10 mg total) by mouth daily.  Dispense: 90 tablet; Refill: 0  Return in about 3 months (around 10/26/2018) for CPE/follow up.  The entirety of the information documented in the History of Present Illness, Review of Systems and Physical Exam were personally obtained by me. Portions of this information were initially documented by Lynford Humphrey, CMA and reviewed by me for thoroughness and accuracy.        Trinna Post, PA-C  [facility]

## 2018-07-27 ENCOUNTER — Telehealth: Payer: Self-pay

## 2018-07-27 LAB — CBC WITH DIFFERENTIAL/PLATELET
Basophils Absolute: 0.1 10*3/uL (ref 0.0–0.2)
Basos: 1 %
EOS (ABSOLUTE): 0.5 10*3/uL — ABNORMAL HIGH (ref 0.0–0.4)
Eos: 3 %
Hematocrit: 43.8 % (ref 34.0–46.6)
Hemoglobin: 14.6 g/dL (ref 11.1–15.9)
Immature Grans (Abs): 0 10*3/uL (ref 0.0–0.1)
Immature Granulocytes: 0 %
Lymphocytes Absolute: 4.5 10*3/uL — ABNORMAL HIGH (ref 0.7–3.1)
Lymphs: 29 %
MCH: 28.9 pg (ref 26.6–33.0)
MCHC: 33.3 g/dL (ref 31.5–35.7)
MCV: 87 fL (ref 79–97)
Monocytes Absolute: 0.6 10*3/uL (ref 0.1–0.9)
Monocytes: 4 %
Neutrophils Absolute: 9.5 10*3/uL — ABNORMAL HIGH (ref 1.4–7.0)
Neutrophils: 63 %
Platelets: 286 10*3/uL (ref 150–450)
RBC: 5.06 x10E6/uL (ref 3.77–5.28)
RDW: 13 % (ref 12.3–15.4)
WBC: 15.1 10*3/uL — ABNORMAL HIGH (ref 3.4–10.8)

## 2018-07-27 LAB — COMPREHENSIVE METABOLIC PANEL
ALT: 21 IU/L (ref 0–32)
AST: 16 IU/L (ref 0–40)
Albumin/Globulin Ratio: 1.8 (ref 1.2–2.2)
Albumin: 4.5 g/dL (ref 3.5–5.5)
Alkaline Phosphatase: 85 IU/L (ref 39–117)
BUN/Creatinine Ratio: 22 (ref 9–23)
BUN: 17 mg/dL (ref 6–24)
Bilirubin Total: <0.2 mg/dL (ref 0.0–1.2)
CO2: 21 mmol/L (ref 20–29)
Calcium: 9.8 mg/dL (ref 8.7–10.2)
Chloride: 101 mmol/L (ref 96–106)
Creatinine, Ser: 0.76 mg/dL (ref 0.57–1.00)
GFR calc Af Amer: 101 mL/min/{1.73_m2} (ref >59)
GFR calc non Af Amer: 87 mL/min/{1.73_m2} (ref >59)
Globulin, Total: 2.5 g/dL (ref 1.5–4.5)
Glucose: 130 mg/dL — ABNORMAL HIGH (ref 65–99)
Potassium: 4.2 mmol/L (ref 3.5–5.2)
Sodium: 139 mmol/L (ref 134–144)
Total Protein: 7 g/dL (ref 6.0–8.5)

## 2018-07-27 LAB — LIPID PANEL
Chol/HDL Ratio: 5.6 ratio — ABNORMAL HIGH (ref 0.0–4.4)
Cholesterol, Total: 189 mg/dL (ref 100–199)
HDL: 34 mg/dL — ABNORMAL LOW (ref >39)
Triglycerides: 445 mg/dL — ABNORMAL HIGH (ref 0–149)

## 2018-07-27 LAB — HEMOGLOBIN A1C
Est. average glucose Bld gHb Est-mCnc: 146 mg/dL
Hgb A1c MFr Bld: 6.7 % — ABNORMAL HIGH (ref 4.8–5.6)

## 2018-07-27 NOTE — Telephone Encounter (Signed)
[facility] called to add on test.

## 2018-07-27 NOTE — Telephone Encounter (Signed)
-----   Message from Trinna Post, [place] sent at 07/27/2018  8:42 AM EDT ----- Can we add direct LDL to this under diagnosis HLD?

## 2018-07-28 ENCOUNTER — Telehealth: Payer: Self-pay

## 2018-07-28 DIAGNOSIS — E785 Hyperlipidemia, unspecified: Secondary | ICD-10-CM

## 2018-07-28 NOTE — Telephone Encounter (Signed)
-----   Message from Trinna Post, [place] sent at 07/28/2018 11:23 AM EDT ----- LDL not at goal, increase Lipitor to 20 mg daily and I will send in new script. WBC continues to be high which patient reported has already been assessed. A1c down to 6.7% which is great. Remaining labwork is normal.

## 2018-07-28 NOTE — Telephone Encounter (Signed)
lmtcb

## 2018-07-29 LAB — LDL CHOLESTEROL, DIRECT: LDL Direct: 84 mg/dL (ref 0–99)

## 2018-07-29 LAB — SPECIMEN STATUS REPORT

## 2018-07-29 MED ORDER — ATORVASTATIN CALCIUM 20 MG PO TABS: 20.0000 mg | ORAL_TABLET | Freq: Every day | ORAL | 0 refills | Status: DC

## 2018-07-29 NOTE — Telephone Encounter (Signed)
Pt returned missed call. Please call pt back to discuss.  Thanks, [employer]

## 2018-07-29 NOTE — Telephone Encounter (Signed)
Patient advised agrees to increase Lipitor to 20mg . Have you already sent in the prescription? If not, she uses [facility] [address]. Thanks

## 2018-08-16 ENCOUNTER — Other Ambulatory Visit: Payer: Self-pay | Admitting: Physician Assistant

## 2018-08-16 DIAGNOSIS — E1159 Type 2 diabetes mellitus with other circulatory complications: Secondary | ICD-10-CM

## 2018-08-16 DIAGNOSIS — E1100 Type 2 diabetes mellitus with hyperosmolarity without nonketotic hyperglycemic-hyperosmolar coma (NKHHC): Secondary | ICD-10-CM

## 2018-08-16 DIAGNOSIS — I10 Essential (primary) hypertension: Secondary | ICD-10-CM

## 2018-08-16 DIAGNOSIS — I152 Hypertension secondary to endocrine disorders: Secondary | ICD-10-CM

## 2018-08-16 NOTE — Telephone Encounter (Signed)
Patient needs refill on Lisinopril 10 mg. And Metformin 1000 mg.  Sent to [facility] on [address].

## 2018-08-18 MED ORDER — LISINOPRIL 10 MG PO TABS: 10.0000 mg | ORAL_TABLET | Freq: Every day | ORAL | 0 refills | Status: DC

## 2018-08-18 MED ORDER — METFORMIN HCL 1000 MG PO TABS: 1000.0000 mg | ORAL_TABLET | Freq: Two times a day (BID) | ORAL | 0 refills | Status: DC

## 2018-09-26 DIAGNOSIS — M25562 Pain in left knee: Secondary | ICD-10-CM | POA: Diagnosis not present

## 2018-09-27 DIAGNOSIS — M25562 Pain in left knee: Secondary | ICD-10-CM | POA: Diagnosis not present

## 2018-09-27 DIAGNOSIS — R600 Localized edema: Secondary | ICD-10-CM | POA: Diagnosis not present

## 2018-09-29 DIAGNOSIS — M25562 Pain in left knee: Secondary | ICD-10-CM | POA: Diagnosis not present

## 2018-10-25 ENCOUNTER — Encounter: Payer: Self-pay | Admitting: Physician Assistant

## 2018-10-25 ENCOUNTER — Ambulatory Visit (INDEPENDENT_AMBULATORY_CARE_PROVIDER_SITE_OTHER): Payer: BLUE CROSS/BLUE SHIELD | Admitting: Physician Assistant

## 2018-10-25 VITALS — BP 126/76 | HR 74 | Temp 97.6°F | Resp 16 | Wt 185.0 lb

## 2018-10-25 DIAGNOSIS — Z716 Tobacco abuse counseling: Secondary | ICD-10-CM | POA: Diagnosis not present

## 2018-10-25 DIAGNOSIS — I10 Essential (primary) hypertension: Secondary | ICD-10-CM

## 2018-10-25 DIAGNOSIS — E1159 Type 2 diabetes mellitus with other circulatory complications: Secondary | ICD-10-CM

## 2018-10-25 DIAGNOSIS — E785 Hyperlipidemia, unspecified: Secondary | ICD-10-CM | POA: Diagnosis not present

## 2018-10-25 DIAGNOSIS — E1100 Type 2 diabetes mellitus with hyperosmolarity without nonketotic hyperglycemic-hyperosmolar coma (NKHHC): Secondary | ICD-10-CM

## 2018-10-25 DIAGNOSIS — I152 Hypertension secondary to endocrine disorders: Secondary | ICD-10-CM

## 2018-10-25 LAB — POCT GLYCOSYLATED HEMOGLOBIN (HGB A1C): Hemoglobin A1C: 7.3 % — AB (ref 4.0–5.6)

## 2018-10-25 MED ORDER — METFORMIN HCL 1000 MG PO TABS: 1000.0000 mg | ORAL_TABLET | Freq: Two times a day (BID) | ORAL | 0 refills | Status: DC

## 2018-10-25 MED ORDER — ATORVASTATIN CALCIUM 20 MG PO TABS: 20.0000 mg | ORAL_TABLET | Freq: Every day | ORAL | 0 refills | Status: DC

## 2018-10-25 MED ORDER — GLYBURIDE 5 MG PO TABS: 5.0000 mg | ORAL_TABLET | Freq: Two times a day (BID) | ORAL | 1 refills | Status: DC

## 2018-10-25 MED ORDER — BUPROPION HCL ER (SR) 150 MG PO TB12: ORAL_TABLET | ORAL | 0 refills | Status: DC

## 2018-10-25 MED ORDER — LISINOPRIL 10 MG PO TABS: 10.0000 mg | ORAL_TABLET | Freq: Every day | ORAL | 0 refills | Status: DC

## 2018-10-25 NOTE — Progress Notes (Signed)
Patient: Stacie Fleming Female    DOB: 1961-04-19   58 y.o.   MRN: [id number] Visit Date: 10/25/2018  Today's Provider: Trinna Post, PA-C   Chief Complaint  Patient presents with  . Diabetes   Subjective:     HPI  Diabetes Mellitus Type II, Follow-up:   Lab Results  Component Value Date   HGBA1C 7.3 (A) 10/25/2018   HGBA1C 6.7 (H) 07/26/2018   HGBA1C 7.2 (A) 04/13/2018    Last seen for diabetes 3 months ago.  Management since then includes no changes. She reports excellent compliance with treatment. She is not having side effects.  Current symptoms include none and have been stable. Home blood sugar records: fasting range: not being checked  Episodes of hypoglycemia? no   Current Insulin Regimen:  Most Recent Eye Exam: UTD Weight trend: stable Prior visit with dietician: no Current diet: in general, a "healthy" diet   Current exercise: none  Pertinent Labs:    Component Value Date/Time   CHOL 189 07/26/2018 1100   TRIG 445 (H) 07/26/2018 1100   HDL 34 (L) 07/26/2018 1100   LDLCALC Comment 07/26/2018 1100   CREATININE 0.76 07/26/2018 1100    Wt Readings from Last 3 Encounters:  10/25/18 185 lb (83.9 kg)  07/26/18 184 lb (83.5 kg)  05/24/18 182 lb (82.6 kg)   HLD: Taking lipitor 20 mg daily.   Lipid Panel     Component Value Date/Time   CHOL 189 07/26/2018 1100   TRIG 445 (H) 07/26/2018 1100   HDL 34 (L) 07/26/2018 1100   CHOLHDL 5.6 (H) 07/26/2018 1100   LDLCALC Comment 07/26/2018 1100   LDLDIRECT 84 07/26/2018 1100   HTN: Currently taking amlodipine 10 mg daily and lisinopril 10 mg daily.  BP Readings from Last 3 Encounters:  10/25/18 126/76  07/26/18 120/82  05/24/18 (!) 150/81    ------------------------------------------------------------------------   Allergies  Allergen Reactions  . Neosporin [Neomycin-Bacitracin Zn-Polymyx]      Current Outpatient Medications:  .  amLODipine (NORVASC) 10 MG tablet, Take 1 tablet  (10 mg total) by mouth daily., Disp: 90 tablet, Rfl: 0 .  atorvastatin (LIPITOR) 20 MG tablet, Take 1 tablet (20 mg total) by mouth daily., Disp: 90 tablet, Rfl: 0 .  glyBURIDE (DIABETA) 5 MG tablet, Take 5 mg once in the morning and 2.5 mg once in the evening both with meals., Disp: 135 tablet, Rfl: 0 .  ibuprofen (ADVIL,MOTRIN) 200 MG tablet, Take 200 mg by mouth every 6 (six) hours as needed., Disp: , Rfl:  .  lisinopril (PRINIVIL,ZESTRIL) 10 MG tablet, Take 1 tablet (10 mg total) by mouth daily., Disp: 90 tablet, Rfl: 0 .  metFORMIN (GLUCOPHAGE) 1000 MG tablet, Take 1 tablet (1,000 mg total) by mouth 2 (two) times daily with a meal., Disp: 180 tablet, Rfl: 0  Review of Systems  Constitutional: Negative.   Respiratory: Negative.   Cardiovascular: Negative.   Endocrine: Negative for polydipsia, polyphagia and polyuria.    Social History   Tobacco Use  . Smoking status: Current Every Day Smoker    Packs/day: 0.50    Years: 40.00    Pack years: 20.00    Types: Cigarettes  . Smokeless tobacco: Never Used  Substance Use Topics  . Alcohol use: No    Frequency: Never      Objective:   BP 126/76 (BP Location: Left Arm, Patient Position: Sitting, Cuff Size: Normal)   Pulse 74   Temp 97.6  F (36.4 C) (Oral)   Resp 16   Wt 185 lb (83.9 kg)   BMI 30.79 kg/m  Vitals:   10/25/18 1003  BP: 126/76  Pulse: 74  Resp: 16  Temp: 97.6 F (36.4 C)  TempSrc: Oral  Weight: 185 lb (83.9 kg)     Physical Exam Constitutional:      Appearance: Normal appearance. She is obese.  Cardiovascular:     Rate and Rhythm: Normal rate and regular rhythm.     Heart sounds: Normal heart sounds.  Pulmonary:     Effort: Pulmonary effort is normal.     Breath sounds: Normal breath sounds.  Neurological:     Mental Status: She is alert and oriented to person, place, and time. Mental status is at baseline.  Psychiatric:        Mood and Affect: Mood normal.        Behavior: Behavior normal.          Assessment & Plan    1. Type 2 diabetes mellitus with hyperosmolarity without coma, unspecified whether long term insulin use (HCC)  A1c increased to 7.3%. Will increase glyburide to 5 mg twice daily. If not effective, will switch to different class, likely SGLT2.  - POCT glycosylated hemoglobin (Hb A1C) - metFORMIN (GLUCOPHAGE) 1000 MG tablet; Take 1 tablet (1,000 mg total) by mouth 2 (two) times daily with a meal.  Dispense: 180 tablet; Refill: 0 - glyBURIDE (DIABETA) 5 MG tablet; Take 1 tablet (5 mg total) by mouth 2 (two) times daily with a meal.  Dispense: 180 tablet; Refill: 1  2. Hyperlipidemia, unspecified hyperlipidemia type  Continue as below.  - atorvastatin (LIPITOR) 20 MG tablet; Take 1 tablet (20 mg total) by mouth daily.  Dispense: 90 tablet; Refill: 0  3. Hypertension associated with diabetes ([place])  Continue lisinopril 10 mg daily and amlodipine 10 mg daily.  - lisinopril (PRINIVIL,ZESTRIL) 10 MG tablet; Take 1 tablet (10 mg total) by mouth daily.  Dispense: 90 tablet; Refill: 0  4. Encounter for tobacco use cessation counseling  I have counseled patient >3 minutes about tobacco cessation.  - buPROPion (WELLBUTRIN SR) 150 MG 12 hr tablet; Take one pill daily for three days. Then take one pill 2x daily onward.  Dispense: 180 tablet; Refill: 0  Return in about 3 months (around 01/24/2019) for DM, HTN, HLD.  The entirety of the information documented in the History of Present Illness, Review of Systems and Physical Exam were personally obtained by me. Portions of this information were initially documented by Lynford Humphrey, CMA and reviewed by me for thoroughness and accuracy.       Trinna Post, PA-C  [facility]

## 2018-10-26 NOTE — Patient Instructions (Signed)
Diabetes Mellitus and Exercise Exercising regularly is important for your overall health, especially when you have diabetes (diabetes mellitus). Exercising is not only about losing weight. It has many other health benefits, such as increasing muscle strength and bone density and reducing body fat and stress. This leads to improved fitness, flexibility, and endurance, all of which result in better overall health. Exercise has additional benefits for people with diabetes, including:  Reducing appetite.  Helping to lower and control blood glucose.  Lowering blood pressure.  Helping to control amounts of fatty substances (lipids) in the blood, such as cholesterol and triglycerides.  Helping the body to respond better to insulin (improving insulin sensitivity).  Reducing how much insulin the body needs.  Decreasing the risk for heart disease by: ? Lowering cholesterol and triglyceride levels. ? Increasing the levels of good cholesterol. ? Lowering blood glucose levels. What is my activity plan? Your health care provider or certified diabetes educator can help you make a plan for the type and frequency of exercise (activity plan) that works for you. Make sure that you:  Do at least 150 minutes of moderate-intensity or vigorous-intensity exercise each week. This could be brisk walking, biking, or water aerobics. ? Do stretching and strength exercises, such as yoga or weightlifting, at least 2 times a week. ? Spread out your activity over at least 3 days of the week.  Get some form of physical activity every day. ? Do not go more than 2 days in a row without some kind of physical activity. ? Avoid being inactive for more than 30 minutes at a time. Take frequent breaks to walk or stretch.  Choose a type of exercise or activity that you enjoy, and set realistic goals.  Start slowly, and gradually increase the intensity of your exercise over time. What do I need to know about managing my  diabetes?   Check your blood glucose before and after exercising. ? If your blood glucose is 240 mg/dL (13.3 mmol/L) or higher before you exercise, check your urine for ketones. If you have ketones in your urine, do not exercise until your blood glucose returns to normal. ? If your blood glucose is 100 mg/dL (5.6 mmol/L) or lower, eat a snack containing 15-20 grams of carbohydrate. Check your blood glucose 15 minutes after the snack to make sure that your level is above 100 mg/dL (5.6 mmol/L) before you start your exercise.  Know the symptoms of low blood glucose (hypoglycemia) and how to treat it. Your risk for hypoglycemia increases during and after exercise. Common symptoms of hypoglycemia can include: ? Hunger. ? Anxiety. ? Sweating and feeling clammy. ? Confusion. ? Dizziness or feeling light-headed. ? Increased heart rate or palpitations. ? Blurry vision. ? Tingling or numbness around the mouth, lips, or tongue. ? Tremors or shakes. ? Irritability.  Keep a rapid-acting carbohydrate snack available before, during, and after exercise to help prevent or treat hypoglycemia.  Avoid injecting insulin into areas of the body that are going to be exercised. For example, avoid injecting insulin into: ? The arms, when playing tennis. ? The legs, when jogging.  Keep records of your exercise habits. Doing this can help you and your health care provider adjust your diabetes management plan as needed. Write down: ? Food that you eat before and after you exercise. ? Blood glucose levels before and after you exercise. ? The type and amount of exercise you have done. ? When your insulin is expected to peak, if you use   insulin. Avoid exercising at times when your insulin is peaking.  When you start a new exercise or activity, work with your health care provider to make sure the activity is safe for you, and to adjust your insulin, medicines, or food intake as needed.  Drink plenty of water while  you exercise to prevent dehydration or heat stroke. Drink enough fluid to keep your urine clear or pale yellow. Summary  Exercising regularly is important for your overall health, especially when you have diabetes (diabetes mellitus).  Exercising has many health benefits, such as increasing muscle strength and bone density and reducing body fat and stress.  Your health care provider or certified diabetes educator can help you make a plan for the type and frequency of exercise (activity plan) that works for you.  When you start a new exercise or activity, work with your health care provider to make sure the activity is safe for you, and to adjust your insulin, medicines, or food intake as needed. This information is not intended to replace advice given to you by your health care provider. Make sure you discuss any questions you have with your health care provider. Document Released: 12/26/2003 Document Revised: 04/15/2017 Document Reviewed: 03/16/2016 Elsevier Interactive Patient Education  2019 Elsevier Inc.  

## 2018-11-07 DIAGNOSIS — M25562 Pain in left knee: Secondary | ICD-10-CM | POA: Diagnosis not present

## 2018-12-06 DIAGNOSIS — M25562 Pain in left knee: Secondary | ICD-10-CM | POA: Diagnosis not present

## 2019-01-24 ENCOUNTER — Ambulatory Visit (INDEPENDENT_AMBULATORY_CARE_PROVIDER_SITE_OTHER): Payer: BLUE CROSS/BLUE SHIELD | Admitting: Physician Assistant

## 2019-01-24 ENCOUNTER — Encounter: Payer: Self-pay | Admitting: Physician Assistant

## 2019-01-24 VITALS — BP 156/82 | HR 73

## 2019-01-24 DIAGNOSIS — Z1239 Encounter for other screening for malignant neoplasm of breast: Secondary | ICD-10-CM | POA: Diagnosis not present

## 2019-01-24 DIAGNOSIS — E785 Hyperlipidemia, unspecified: Secondary | ICD-10-CM | POA: Diagnosis not present

## 2019-01-24 DIAGNOSIS — E1100 Type 2 diabetes mellitus with hyperosmolarity without nonketotic hyperglycemic-hyperosmolar coma (NKHHC): Secondary | ICD-10-CM

## 2019-01-24 DIAGNOSIS — I152 Hypertension secondary to endocrine disorders: Secondary | ICD-10-CM

## 2019-01-24 DIAGNOSIS — I10 Essential (primary) hypertension: Secondary | ICD-10-CM

## 2019-01-24 DIAGNOSIS — E1159 Type 2 diabetes mellitus with other circulatory complications: Secondary | ICD-10-CM

## 2019-01-24 MED ORDER — METFORMIN HCL 1000 MG PO TABS: 1000.0000 mg | ORAL_TABLET | Freq: Two times a day (BID) | ORAL | 0 refills | Status: DC

## 2019-01-24 MED ORDER — AMLODIPINE BESYLATE 10 MG PO TABS: 10.0000 mg | ORAL_TABLET | Freq: Every day | ORAL | 0 refills | Status: DC

## 2019-01-24 MED ORDER — ATORVASTATIN CALCIUM 20 MG PO TABS: 20.0000 mg | ORAL_TABLET | Freq: Every day | ORAL | 0 refills | Status: DC

## 2019-01-24 MED ORDER — DULAGLUTIDE 0.75 MG/0.5ML ~~LOC~~ SOAJ: 0.7500 mg | SUBCUTANEOUS | 1 refills | Status: DC

## 2019-01-24 MED ORDER — LISINOPRIL 10 MG PO TABS: 10.0000 mg | ORAL_TABLET | Freq: Every day | ORAL | 0 refills | Status: DC

## 2019-01-24 NOTE — Progress Notes (Addendum)
Patient: Stacie Fleming Female    DOB: 1961-06-01   58 y.o.   MRN: [id number] Visit Date: 02/01/2019  Today's Provider: Trinna Post, PA-C   Chief Complaint  Patient presents with  . Virtual Visit    Follow-up DM.HLD, HTN   Subjective:    Virtual Visit via Video Note  I connected with Stacie Fleming on 02/01/19 at 10:00 AM EDT by a video enabled telemedicine application and verified that I am speaking with the correct person using two identifiers.   I discussed the limitations of evaluation and management by telemedicine and the availability of in person appointments. The patient expressed understanding and agreed to proceed.  HPI   Patient doing a virtual visit with provider to follow-up on DM,HLD, HTN.  Diabetes Mellitus Type II, Follow-up:   Lab Results  Component Value Date   HGBA1C 7.3 (A) 10/25/2018   HGBA1C 6.7 (H) 07/26/2018   HGBA1C 7.2 (A) 04/13/2018   Last seen for diabetes 3 months ago.  Management since then includes increase Glyburide to 5 mg twice daily if not effective, will switch to different class, likely SGLT2.  Current Insulin Regimen:  Most Recent Eye Exam: UTD Weight trend: stable Prior visit with dietician: no Current diet: in general, a "healthy" diet   Current exercise: none  Today's A1C is 8.0%.  ------------------------------------------------------------------------   Hypertension, follow-up:  BP Readings from Last 3 Encounters:  01/24/19 (!) 156/82  10/25/18 126/76  07/26/18 120/82    She was last seen for hypertension 3 months ago.  BP at that visit was 126/76. Management since that visit includes none.She reports excellent compliance with treatment. She is not having side effects.   ------------------------------------------------------------------------    Lipid/Cholesterol, Follow-up:   Last seen for this 3 months ago.  Management since that visit includes none.Continue Atorvastatin 20 mg.  Last Lipid  Panel:    Component Value Date/Time   CHOL 189 07/26/2018 1100   TRIG 445 (H) 07/26/2018 1100   HDL 34 (L) 07/26/2018 1100   CHOLHDL 5.6 (H) 07/26/2018 1100   LDLCALC Comment 07/26/2018 1100   LDLDIRECT 84 07/26/2018 1100    She reports excellent compliance with treatment. She is not having side effects.       Allergies  Allergen Reactions  . Neosporin [Neomycin-Bacitracin Zn-Polymyx]      Current Outpatient Medications:  .  amLODipine (NORVASC) 10 MG tablet, Take 1 tablet (10 mg total) by mouth daily., Disp: 90 tablet, Rfl: 0 .  atorvastatin (LIPITOR) 20 MG tablet, Take 1 tablet (20 mg total) by mouth daily., Disp: 90 tablet, Rfl: 0 .  ibuprofen (ADVIL,MOTRIN) 200 MG tablet, Take 200 mg by mouth every 6 (six) hours as needed., Disp: , Rfl:  .  lisinopril (PRINIVIL,ZESTRIL) 10 MG tablet, Take 1 tablet (10 mg total) by mouth daily., Disp: 90 tablet, Rfl: 0 .  metFORMIN (GLUCOPHAGE) 1000 MG tablet, Take 1 tablet (1,000 mg total) by mouth 2 (two) times daily with a meal., Disp: 180 tablet, Rfl: 0 .  Dulaglutide (TRULICITY) 9.03 ES/9.2ZR SOPN, Inject 0.75 mg into the skin once a week., Disp: 4 pen, Rfl: 1  Review of Systems  Social History   Tobacco Use  . Smoking status: Current Every Day Smoker    Packs/day: 0.50    Years: 40.00    Pack years: 20.00    Types: Cigarettes  . Smokeless tobacco: Never Used  Substance Use Topics  . Alcohol use: No  Frequency: Never      Objective:   BP (!) 156/82   Pulse 73  Vitals:   01/24/19 1004  BP: (!) 156/82  Pulse: 73     Physical Exam Constitutional:      Appearance: Normal appearance.  Pulmonary:     Effort: Pulmonary effort is normal. No respiratory distress.  Neurological:     Mental Status: She is alert. Mental status is at baseline.  Psychiatric:        Mood and Affect: Mood normal.        Behavior: Behavior normal.         Assessment & Plan    1. Type 2 diabetes mellitus with hyperosmolarity without  coma, unspecified whether long term insulin use (HCC)  Increased today at 8%, will start trulicity as below for better control.  - Hemoglobin A1c - Dulaglutide (TRULICITY) 7.82 UM/3.5TI SOPN; Inject 0.75 mg into the skin once a week.  Dispense: 4 pen; Refill: 1 - metFORMIN (GLUCOPHAGE) 1000 MG tablet; Take 1 tablet (1,000 mg total) by mouth 2 (two) times daily with a meal.  Dispense: 180 tablet; Refill: 0  2. Hyperlipidemia, unspecified hyperlipidemia type  - atorvastatin (LIPITOR) 20 MG tablet; Take 1 tablet (20 mg total) by mouth daily.  Dispense: 90 tablet; Refill: 0  3. Hypertension associated with diabetes ([place])  Elevated today, continue current medications and observe at next visit.  - lisinopril (PRINIVIL,ZESTRIL) 10 MG tablet; Take 1 tablet (10 mg total) by mouth daily.  Dispense: 90 tablet; Refill: 0 - amLODipine (NORVASC) 10 MG tablet; Take 1 tablet (10 mg total) by mouth daily.  Dispense: 90 tablet; Refill: 0  4. Breast cancer screening  - MM Digital Screening; Future  The entirety of the information documented in the History of Present Illness, Review of Systems and Physical Exam were personally obtained by me. Portions of this information were initially documented by Lyndel Pleasure, CMA and reviewed by me for thoroughness and accuracy.   Return in about 3 months (around 04/25/2019) for DM II .        Trinna Post, PA-C  [facility]

## 2019-01-24 NOTE — Patient Instructions (Signed)

## 2019-02-01 ENCOUNTER — Telehealth: Payer: Self-pay | Admitting: Physician Assistant

## 2019-02-01 NOTE — Telephone Encounter (Signed)
Ciarra with [employer] called saying she was faxing over a new request for PA on Trulicity.  She said the patient has new insurance and this needs to be updated so the insurance will cover.  She said the insurance information was on the form.  Ref Key [id number]  Thanks Con Memos

## 2019-02-01 NOTE — Telephone Encounter (Signed)
Form was received today 02/01/19

## 2019-02-02 NOTE — Telephone Encounter (Signed)
Appt scheduled

## 2019-02-02 NOTE — Telephone Encounter (Signed)
LMTCB to schedule a 3 month follow up for diabetes.

## 2019-02-02 NOTE — Telephone Encounter (Signed)
-----   Message from Trinna Post, [place] sent at 02/01/2019  1:02 PM EDT ----- Please reach out to patient and schedule 3 month diabetes follow up.

## 2019-02-17 DIAGNOSIS — Z1159 Encounter for screening for other viral diseases: Secondary | ICD-10-CM | POA: Diagnosis not present

## 2019-04-26 ENCOUNTER — Other Ambulatory Visit: Payer: Self-pay | Admitting: Physician Assistant

## 2019-04-26 DIAGNOSIS — I152 Hypertension secondary to endocrine disorders: Secondary | ICD-10-CM

## 2019-04-26 DIAGNOSIS — E1159 Type 2 diabetes mellitus with other circulatory complications: Secondary | ICD-10-CM

## 2019-05-02 ENCOUNTER — Other Ambulatory Visit: Payer: Self-pay

## 2019-05-02 ENCOUNTER — Encounter: Payer: Self-pay | Admitting: Physician Assistant

## 2019-05-02 ENCOUNTER — Ambulatory Visit: Payer: BC Managed Care – PPO | Admitting: Physician Assistant

## 2019-05-02 VITALS — BP 135/77 | HR 71 | Temp 97.9°F | Wt 186.8 lb

## 2019-05-02 DIAGNOSIS — R600 Localized edema: Secondary | ICD-10-CM

## 2019-05-02 DIAGNOSIS — E1100 Type 2 diabetes mellitus with hyperosmolarity without nonketotic hyperglycemic-hyperosmolar coma (NKHHC): Secondary | ICD-10-CM

## 2019-05-02 DIAGNOSIS — I152 Hypertension secondary to endocrine disorders: Secondary | ICD-10-CM

## 2019-05-02 DIAGNOSIS — E785 Hyperlipidemia, unspecified: Secondary | ICD-10-CM

## 2019-05-02 DIAGNOSIS — E1159 Type 2 diabetes mellitus with other circulatory complications: Secondary | ICD-10-CM

## 2019-05-02 DIAGNOSIS — I10 Essential (primary) hypertension: Secondary | ICD-10-CM

## 2019-05-02 MED ORDER — ATORVASTATIN CALCIUM 20 MG PO TABS: 20.0000 mg | ORAL_TABLET | Freq: Every day | ORAL | 1 refills | Status: DC

## 2019-05-02 MED ORDER — FARXIGA 5 MG PO TABS: 5.0000 mg | ORAL_TABLET | Freq: Every day | ORAL | 0 refills | Status: AC

## 2019-05-02 MED ORDER — LISINOPRIL 10 MG PO TABS: 10.0000 mg | ORAL_TABLET | Freq: Every day | ORAL | 1 refills | Status: DC

## 2019-05-02 MED ORDER — AMLODIPINE BESYLATE 10 MG PO TABS: 10.0000 mg | ORAL_TABLET | Freq: Every day | ORAL | 1 refills | Status: DC

## 2019-05-02 MED ORDER — METFORMIN HCL 1000 MG PO TABS: 1000.0000 mg | ORAL_TABLET | Freq: Two times a day (BID) | ORAL | 1 refills | Status: DC

## 2019-05-02 NOTE — Progress Notes (Signed)
Patient: Stacie Fleming Female    DOB: 1961/07/07   58 y.o.   MRN: [id number] Visit Date: 05/02/2019  Today's Provider: Trinna Post, PA-C   Chief Complaint  Patient presents with  . Diabetes   Subjective:     HPI   COVID positive in early may, works at [employer] as a Marine scientist. She was asymptomatic.   Quit smoking in March. Does not miss it.    Diabetes Mellitus Type II, Follow-up:   She was changed from glipizide to trulicity in April 7616 for better glycemic control and weight loss. She reports she took this medication for two months and that the injection causes a lot of pain. She has stopped taking it. Reports elevated fasting blood sugars. Continues on metformin 1000 mg BID.   Recent Labs       Lab Results  Component Value Date   HGBA1C 7.3 (A) 10/25/2018   HGBA1C 6.7 (H) 07/26/2018   HGBA1C 7.2 (A) 04/13/2018     Last seen for diabetes 3 months ago.  Management since then includes continue Metformin 1000 mg BID and start Trulicity. Current Insulin Regimen: Trulicity FBS ranging between 180-200 Most Recent Eye Exam: not UTD- last eye exam was 02/17/2018. Weight trend: stable Prior visit with dietician: no Current diet: in general, light diet Current exercise: none  Wt Readings from Last 3 Encounters:  05/02/19 186 lb 12.8 oz (84.7 kg)  10/25/18 185 lb (83.9 kg)  07/26/18 184 lb (83.5 kg)   HLD: Taking lipitor 20 mg QHS without issue.   Lipid Panel     Component Value Date/Time   CHOL 189 07/26/2018 1100   TRIG 445 (H) 07/26/2018 1100   HDL 34 (L) 07/26/2018 1100   CHOLHDL 5.6 (H) 07/26/2018 1100   LDLCALC Comment 07/26/2018 1100   LDLDIRECT 84 07/26/2018 1100   HTN: Taking lisinopril 10 mg daily and amlodipine 10 mg daily without issue.  Lower Extremity Swelling: Reports she has swelling in her legs, left worse than right, especially at the end of the day.   ------------------------------------------------------------------------  Allergies  Allergen Reactions  . Other Other (See Comments)    Per patient  . Neosporin [Neomycin-Bacitracin Zn-Polymyx]   . Tape      Current Outpatient Medications:  .  amLODipine (NORVASC) 10 MG tablet, TAKE ONE TABLET BY MOUTH ONE TIME DAILY, Disp: 90 tablet, Rfl: 0 .  atorvastatin (LIPITOR) 20 MG tablet, Take 1 tablet (20 mg total) by mouth daily., Disp: 90 tablet, Rfl: 0 .  Dulaglutide (TRULICITY) 0.73 XT/0.6YI SOPN, Inject 0.75 mg into the skin once a week., Disp: 4 pen, Rfl: 1 .  ibuprofen (ADVIL,MOTRIN) 200 MG tablet, Take 200 mg by mouth every 6 (six) hours as needed., Disp: , Rfl:  .  metFORMIN (GLUCOPHAGE) 1000 MG tablet, Take 1 tablet (1,000 mg total) by mouth 2 (two) times daily with a meal., Disp: 180 tablet, Rfl: 0 .  lisinopril (PRINIVIL,ZESTRIL) 10 MG tablet, Take 1 tablet (10 mg total) by mouth daily., Disp: 90 tablet, Rfl: 0  Review of Systems  Constitutional: Negative.   Respiratory: Negative.   Cardiovascular: Negative.   Musculoskeletal: Negative.     Social History   Tobacco Use  . Smoking status: Current Every Day Smoker    Packs/day: 0.50    Years: 40.00    Pack years: 20.00    Types: Cigarettes  . Smokeless tobacco: Never Used  Substance Use Topics  . Alcohol use:  No    Frequency: Never      Objective:   BP 135/77 (BP Location: Left Arm, Patient Position: Sitting, Cuff Size: Large)   Pulse 71   Temp 97.9 F (36.6 C) (Oral)   Wt 186 lb 12.8 oz (84.7 kg)   SpO2 98%   BMI 31.09 kg/m  Vitals:   05/02/19 0942  BP: 135/77  Pulse: 71  Temp: 97.9 F (36.6 C)  TempSrc: Oral  SpO2: 98%  Weight: 186 lb 12.8 oz (84.7 kg)     Physical Exam Constitutional:      Appearance: Normal appearance.  Cardiovascular:     Rate and Rhythm: Normal rate and regular rhythm.     Pulses: Normal pulses.     Heart sounds: Normal heart sounds.  Pulmonary:     Effort: Pulmonary  effort is normal.     Breath sounds: Normal breath sounds.  Musculoskeletal:     Comments: Trace nonpitting edema in left leg.   Skin:    General: Skin is warm and dry.  Neurological:     Mental Status: She is alert.  Psychiatric:        Mood and Affect: Mood normal.        Behavior: Behavior normal.      No results found for any visits on 05/02/19.     Assessment & Plan    1. Type 2 diabetes mellitus with hyperosmolarity without coma, unspecified whether long term insulin use (HCC)  Switch trulicity to farxiga due to intolerance. She has been on glipizide previously with suboptimal glycemic control. She will continue metformin. She is going to schedule her eye exam.  - Urine Microalbumin w/creat. ratio - HgB A1c - dapagliflozin propanediol (FARXIGA) 5 MG TABS tablet; Take 5 mg by mouth daily.  Dispense: 450 mg; Refill: 0 - metFORMIN (GLUCOPHAGE) 1000 MG tablet; Take 1 tablet (1,000 mg total) by mouth 2 (two) times daily with a meal.  Dispense: 180 tablet; Refill: 1  2. Hypertension associated with diabetes ([place])  - lisinopril (ZESTRIL) 10 MG tablet; Take 1 tablet (10 mg total) by mouth daily.  Dispense: 90 tablet; Refill: 1 - amLODipine (NORVASC) 10 MG tablet; Take 1 tablet (10 mg total) by mouth daily.  Dispense: 90 tablet; Refill: 1  3. Hyperlipidemia, unspecified hyperlipidemia type  - atorvastatin (LIPITOR) 20 MG tablet; Take 1 tablet (20 mg total) by mouth daily.  Dispense: 90 tablet; Refill: 1  4. Lower extremity edema  Advised to wear compression stockings.   The entirety of the information documented in the History of Present Illness, Review of Systems and Physical Exam were personally obtained by me. Portions of this information were initially documented by Idelle Anie, CMA and reviewed by me for thoroughness and accuracy.   F/u 3 months for CPE and chronic         Trinna Post, PA-C  [facility]

## 2019-05-02 NOTE — Patient Instructions (Signed)
Diabetes Mellitus and Exercise Exercising regularly is important for your overall health, especially when you have diabetes (diabetes mellitus). Exercising is not only about losing weight. It has many other health benefits, such as increasing muscle strength and bone density and reducing body fat and stress. This leads to improved fitness, flexibility, and endurance, all of which result in better overall health. Exercise has additional benefits for people with diabetes, including:  Reducing appetite.  Helping to lower and control blood glucose.  Lowering blood pressure.  Helping to control amounts of fatty substances (lipids) in the blood, such as cholesterol and triglycerides.  Helping the body to respond better to insulin (improving insulin sensitivity).  Reducing how much insulin the body needs.  Decreasing the risk for heart disease by: ? Lowering cholesterol and triglyceride levels. ? Increasing the levels of good cholesterol. ? Lowering blood glucose levels. What is my activity plan? Your health care provider or certified diabetes educator can help you make a plan for the type and frequency of exercise (activity plan) that works for you. Make sure that you:  Do at least 150 minutes of moderate-intensity or vigorous-intensity exercise each week. This could be brisk walking, biking, or water aerobics. ? Do stretching and strength exercises, such as yoga or weightlifting, at least 2 times a week. ? Spread out your activity over at least 3 days of the week.  Get some form of physical activity every day. ? Do not go more than 2 days in a row without some kind of physical activity. ? Avoid being inactive for more than 30 minutes at a time. Take frequent breaks to walk or stretch.  Choose a type of exercise or activity that you enjoy, and set realistic goals.  Start slowly, and gradually increase the intensity of your exercise over time. What do I need to know about managing my  diabetes?   Check your blood glucose before and after exercising. ? If your blood glucose is 240 mg/dL (13.3 mmol/L) or higher before you exercise, check your urine for ketones. If you have ketones in your urine, do not exercise until your blood glucose returns to normal. ? If your blood glucose is 100 mg/dL (5.6 mmol/L) or lower, eat a snack containing 15-20 grams of carbohydrate. Check your blood glucose 15 minutes after the snack to make sure that your level is above 100 mg/dL (5.6 mmol/L) before you start your exercise.  Know the symptoms of low blood glucose (hypoglycemia) and how to treat it. Your risk for hypoglycemia increases during and after exercise. Common symptoms of hypoglycemia can include: ? Hunger. ? Anxiety. ? Sweating and feeling clammy. ? Confusion. ? Dizziness or feeling light-headed. ? Increased heart rate or palpitations. ? Blurry vision. ? Tingling or numbness around the mouth, lips, or tongue. ? Tremors or shakes. ? Irritability.  Keep a rapid-acting carbohydrate snack available before, during, and after exercise to help prevent or treat hypoglycemia.  Avoid injecting insulin into areas of the body that are going to be exercised. For example, avoid injecting insulin into: ? The arms, when playing tennis. ? The legs, when jogging.  Keep records of your exercise habits. Doing this can help you and your health care provider adjust your diabetes management plan as needed. Write down: ? Food that you eat before and after you exercise. ? Blood glucose levels before and after you exercise. ? The type and amount of exercise you have done. ? When your insulin is expected to peak, if you use   insulin. Avoid exercising at times when your insulin is peaking.  When you start a new exercise or activity, work with your health care provider to make sure the activity is safe for you, and to adjust your insulin, medicines, or food intake as needed.  Drink plenty of water while  you exercise to prevent dehydration or heat stroke. Drink enough fluid to keep your urine clear or pale yellow. Summary  Exercising regularly is important for your overall health, especially when you have diabetes (diabetes mellitus).  Exercising has many health benefits, such as increasing muscle strength and bone density and reducing body fat and stress.  Your health care provider or certified diabetes educator can help you make a plan for the type and frequency of exercise (activity plan) that works for you.  When you start a new exercise or activity, work with your health care provider to make sure the activity is safe for you, and to adjust your insulin, medicines, or food intake as needed. This information is not intended to replace advice given to you by your health care provider. Make sure you discuss any questions you have with your health care provider. Document Released: 12/26/2003 Document Revised: 04/29/2017 Document Reviewed: 03/16/2016 Elsevier Patient Education  2020 [employer]  

## 2019-05-03 LAB — MICROALBUMIN / CREATININE URINE RATIO
Creatinine, Urine: 98 mg/dL
Microalb/Creat Ratio: 42 mg/g creat — ABNORMAL HIGH (ref 0–29)
Microalbumin, Urine: 40.8 ug/mL

## 2019-05-03 LAB — HEMOGLOBIN A1C
Est. average glucose Bld gHb Est-mCnc: 203 mg/dL
Hgb A1c MFr Bld: 8.7 % — ABNORMAL HIGH (ref 4.8–5.6)

## 2019-08-03 ENCOUNTER — Other Ambulatory Visit: Payer: Self-pay

## 2019-08-03 ENCOUNTER — Ambulatory Visit: Payer: BC Managed Care – PPO | Admitting: Physician Assistant

## 2019-08-03 ENCOUNTER — Encounter: Payer: Self-pay | Admitting: Physician Assistant

## 2019-08-03 VITALS — BP 116/73 | HR 65 | Temp 96.9°F | Resp 16 | Ht 65.0 in | Wt 185.0 lb

## 2019-08-03 DIAGNOSIS — I10 Essential (primary) hypertension: Secondary | ICD-10-CM | POA: Diagnosis not present

## 2019-08-03 DIAGNOSIS — E785 Hyperlipidemia, unspecified: Secondary | ICD-10-CM

## 2019-08-03 DIAGNOSIS — Z1329 Encounter for screening for other suspected endocrine disorder: Secondary | ICD-10-CM | POA: Diagnosis not present

## 2019-08-03 DIAGNOSIS — E1159 Type 2 diabetes mellitus with other circulatory complications: Secondary | ICD-10-CM | POA: Diagnosis not present

## 2019-08-03 DIAGNOSIS — E1100 Type 2 diabetes mellitus with hyperosmolarity without nonketotic hyperglycemic-hyperosmolar coma (NKHHC): Secondary | ICD-10-CM | POA: Diagnosis not present

## 2019-08-03 DIAGNOSIS — F17200 Nicotine dependence, unspecified, uncomplicated: Secondary | ICD-10-CM

## 2019-08-03 DIAGNOSIS — M79602 Pain in left arm: Secondary | ICD-10-CM

## 2019-08-03 DIAGNOSIS — Z0000 Encounter for general adult medical examination without abnormal findings: Secondary | ICD-10-CM

## 2019-08-03 DIAGNOSIS — I152 Hypertension secondary to endocrine disorders: Secondary | ICD-10-CM

## 2019-08-03 LAB — POCT GLYCOSYLATED HEMOGLOBIN (HGB A1C)
Est. average glucose Bld gHb Est-mCnc: 217
Hemoglobin A1C: 9.2 % — AB (ref 4.0–5.6)

## 2019-08-03 MED ORDER — BUPROPION HCL ER (SR) 150 MG PO TB12: ORAL_TABLET | ORAL | 0 refills | Status: DC

## 2019-08-03 MED ORDER — GABAPENTIN 300 MG PO CAPS: 300.0000 mg | ORAL_CAPSULE | Freq: Three times a day (TID) | ORAL | 1 refills | Status: DC

## 2019-08-03 MED ORDER — LISINOPRIL 10 MG PO TABS: 10.0000 mg | ORAL_TABLET | Freq: Every day | ORAL | 0 refills | Status: DC

## 2019-08-03 MED ORDER — GLIPIZIDE ER 5 MG PO TB24: 5.0000 mg | ORAL_TABLET | Freq: Every day | ORAL | 0 refills | Status: DC

## 2019-08-03 MED ORDER — FARXIGA 10 MG PO TABS: 10.0000 mg | ORAL_TABLET | Freq: Every day | ORAL | 0 refills | Status: DC

## 2019-08-03 NOTE — Patient Instructions (Signed)
Please schedule your eye exam at [facility].    Diabetes Mellitus and Exercise Exercising regularly is important for your overall health, especially when you have diabetes (diabetes mellitus). Exercising is not only about losing weight. It has many other health benefits, such as increasing muscle strength and bone density and reducing body fat and stress. This leads to improved fitness, flexibility, and endurance, all of which result in better overall health. Exercise has additional benefits for people with diabetes, including:  Reducing appetite.  Helping to lower and control blood glucose.  Lowering blood pressure.  Helping to control amounts of fatty substances (lipids) in the blood, such as cholesterol and triglycerides.  Helping the body to respond better to insulin (improving insulin sensitivity).  Reducing how much insulin the body needs.  Decreasing the risk for heart disease by: ? Lowering cholesterol and triglyceride levels. ? Increasing the levels of good cholesterol. ? Lowering blood glucose levels. What is my activity plan? Your health care provider or certified diabetes educator can help you make a plan for the type and frequency of exercise (activity plan) that works for you. Make sure that you:  Do at least 150 minutes of moderate-intensity or vigorous-intensity exercise each week. This could be brisk walking, biking, or water aerobics. ? Do stretching and strength exercises, such as yoga or weightlifting, at least 2 times a week. ? Spread out your activity over at least 3 days of the week.  Get some form of physical activity every day. ? Do not go more than 2 days in a row without some kind of physical activity. ? Avoid being inactive for more than 30 minutes at a time. Take frequent breaks to walk or stretch.  Choose a type of exercise or activity that you enjoy, and set realistic goals.  Start slowly, and gradually increase the intensity of your exercise over  time. What do I need to know about managing my diabetes?   Check your blood glucose before and after exercising. ? If your blood glucose is 240 mg/dL (13.3 mmol/L) or higher before you exercise, check your urine for ketones. If you have ketones in your urine, do not exercise until your blood glucose returns to normal. ? If your blood glucose is 100 mg/dL (5.6 mmol/L) or lower, eat a snack containing 15-20 grams of carbohydrate. Check your blood glucose 15 minutes after the snack to make sure that your level is above 100 mg/dL (5.6 mmol/L) before you start your exercise.  Know the symptoms of low blood glucose (hypoglycemia) and how to treat it. Your risk for hypoglycemia increases during and after exercise. Common symptoms of hypoglycemia can include: ? Hunger. ? Anxiety. ? Sweating and feeling clammy. ? Confusion. ? Dizziness or feeling light-headed. ? Increased heart rate or palpitations. ? Blurry vision. ? Tingling or numbness around the mouth, lips, or tongue. ? Tremors or shakes. ? Irritability.  Keep a rapid-acting carbohydrate snack available before, during, and after exercise to help prevent or treat hypoglycemia.  Avoid injecting insulin into areas of the body that are going to be exercised. For example, avoid injecting insulin into: ? The arms, when playing tennis. ? The legs, when jogging.  Keep records of your exercise habits. Doing this can help you and your health care provider adjust your diabetes management plan as needed. Write down: ? Food that you eat before and after you exercise. ? Blood glucose levels before and after you exercise. ? The type and amount of exercise you have done. ?  When your insulin is expected to peak, if you use insulin. Avoid exercising at times when your insulin is peaking.  When you start a new exercise or activity, work with your health care provider to make sure the activity is safe for you, and to adjust your insulin, medicines, or food  intake as needed.  Drink plenty of water while you exercise to prevent dehydration or heat stroke. Drink enough fluid to keep your urine clear or pale yellow. Summary  Exercising regularly is important for your overall health, especially when you have diabetes (diabetes mellitus).  Exercising has many health benefits, such as increasing muscle strength and bone density and reducing body fat and stress.  Your health care provider or certified diabetes educator can help you make a plan for the type and frequency of exercise (activity plan) that works for you.  When you start a new exercise or activity, work with your health care provider to make sure the activity is safe for you, and to adjust your insulin, medicines, or food intake as needed. This information is not intended to replace advice given to you by your health care provider. Make sure you discuss any questions you have with your health care provider. Document Released: 12/26/2003 Document Revised: 04/29/2017 Document Reviewed: 03/16/2016 Elsevier Patient Education  2020 [employer].

## 2019-08-03 NOTE — Progress Notes (Signed)
Patient: Stacie Fleming, Female    DOB: [birth year], 58 y.o.   MRN: [id number] Visit Date: 08/03/2019  Today's Provider: Trinna Post, PA-C   Chief Complaint  Patient presents with  . Annual Exam  . Diabetes   Subjective:     Annual physical exam Stacie Fleming is a 58 y.o. female who presents today for health maintenance and complete physical. She feels well. She reports exercising no. She reports she is sleeping well. 01/11/2018 CPE Colonoscopy: 06/02/2018 multiple polyps, repeat 3 years Mammogram: not done PAP: not indicated  DMII: Taking metformin 1000 mg BID and farxiga 5 mg. Stopped trulicity due to pain from injection. Previously on glyburide 5 mg BID. She is due for eye exam.   Wt Readings from Last 3 Encounters:  08/03/19 185 lb (83.9 kg)  05/02/19 186 lb 12.8 oz (84.7 kg)  10/25/18 185 lb (83.9 kg)     Lab Results  Component Value Date   HGBA1C 9.2 (A) 08/03/2019   HGBA1C 8.7 (H) 05/02/2019   HGBA1C 7.3 (A) 10/25/2018   HTN: Currently taking lisinopril 10 mg daily.   BP Readings from Last 3 Encounters:  08/03/19 116/73  05/02/19 135/77  01/24/19 (!) 156/82     Lipid Panel: Taking lipitor 20 mg QHS without issue.      Component Value Date/Time   CHOL 189 07/26/2018 1100   TRIG 445 (H) 07/26/2018 1100   HDL 34 (L) 07/26/2018 1100   CHOLHDL 5.6 (H) 07/26/2018 1100   LDLCALC Comment 07/26/2018 1100   LDLDIRECT 84 07/26/2018 1100   LABVLDL Comment 07/26/2018 1100   Patient reports shooting pain in her left arm. It starts in her neck which is painful and goes down her arm. She has trouble gripping objects but she has this at baseline due to arthritis. She also has some numbness and tingling in her fingertips bilaterally.   Reports she has started smoking again but does not want to and would like to try wellbutrin again.  -----------------------------------------------------------------   Review of Systems  Constitutional: Negative.     HENT: Positive for sinus pressure.   Eyes: Negative.   Respiratory: Negative.   Cardiovascular: Negative.   Gastrointestinal: Negative.   Endocrine: Negative.   Genitourinary: Negative.   Musculoskeletal: Positive for arthralgias.  Skin: Negative.   Allergic/Immunologic: Positive for environmental allergies and food allergies.  Neurological: Negative.   Hematological: Negative.   Psychiatric/Behavioral: Negative.     Social History      She  reports that she has been smoking cigarettes. She has a 20.00 pack-year smoking history. She has never used smokeless tobacco. She reports that she does not drink alcohol or use drugs.       Social History   Socioeconomic History  . Marital status: Divorced    Spouse name: Not on file  . Number of children: Not on file  . Years of education: Not on file  . Highest education level: Not on file  Occupational History  . Not on file  Social Needs  . Financial resource strain: Not on file  . Food insecurity    Worry: Not on file    Inability: Not on file  . Transportation needs    Medical: Not on file    Non-medical: Not on file  Tobacco Use  . Smoking status: Current Some Day Smoker    Packs/day: 0.50    Years: 40.00    Pack years: 20.00    Types:  Cigarettes  . Smokeless tobacco: Never Used  Substance and Sexual Activity  . Alcohol use: No    Frequency: Never  . Drug use: No  . Sexual activity: Not on file  Lifestyle  . Physical activity    Days per week: Not on file    Minutes per session: Not on file  . Stress: Not on file  Relationships  . Social Herbalist on phone: Not on file    Gets together: Not on file    Attends religious service: Not on file    Active member of club or organization: Not on file    Attends meetings of clubs or organizations: Not on file    Relationship status: Not on file  Other Topics Concern  . Not on file  Social History Narrative  . Not on file    Past Medical History:   Diagnosis Date  . Allergy   . Arthritis   . Diabetes mellitus without complication ([place])   . Hyperlipidemia   . Hypertension      Patient Active Problem List   Diagnosis Date Noted  . Positive colorectal cancer screening using Cologuard test 02/28/2018  . Type 2 diabetes mellitus with hyperosmolarity without coma ([place]) 12/14/2017  . Hyperlipidemia 12/14/2017  . Hypertension associated with diabetes ([place]) 12/14/2017    Past Surgical History:  Procedure Laterality Date  . ABDOMINAL HYSTERECTOMY    . CESAREAN SECTION      Family History        Family Status  Relation Name Status  . Mother  Deceased  . Father  Deceased  . Sister  Deceased  . Neg Hx  (Not Specified)        Her family history includes Diabetes in her father, mother, and sister; Heart attack in her father and mother; Hyperlipidemia in her father and mother; Hypothyroidism in her mother; Kidney disease in her sister. There is no history of Colon cancer, Esophageal cancer, Rectal cancer, or Stomach cancer.      Allergies  Allergen Reactions  . Other Other (See Comments)    Per patient  . Neosporin [Neomycin-Bacitracin Zn-Polymyx]   . Tape      Current Outpatient Medications:  .  amLODipine (NORVASC) 10 MG tablet, Take 1 tablet (10 mg total) by mouth daily., Disp: 90 tablet, Rfl: 1 .  atorvastatin (LIPITOR) 20 MG tablet, Take 1 tablet (20 mg total) by mouth daily., Disp: 90 tablet, Rfl: 1 .  ibuprofen (ADVIL,MOTRIN) 200 MG tablet, Take 200 mg by mouth every 6 (six) hours as needed., Disp: , Rfl:  .  metFORMIN (GLUCOPHAGE) 1000 MG tablet, Take 1 tablet (1,000 mg total) by mouth 2 (two) times daily with a meal., Disp: 180 tablet, Rfl: 1 .  buPROPion (WELLBUTRIN SR) 150 MG 12 hr tablet, One tablet daily for three days. Then one tablet twice daily onward., Disp: 180 tablet, Rfl: 0 .  dapagliflozin propanediol (FARXIGA) 10 MG TABS tablet, Take 10 mg by mouth daily before breakfast., Disp: 90 tablet, Rfl: 0 .   glipiZIDE (GLUCOTROL XL) 5 MG 24 hr tablet, Take 1 tablet (5 mg total) by mouth daily with breakfast., Disp: 90 tablet, Rfl: 0 .  lisinopril (ZESTRIL) 10 MG tablet, Take 1 tablet (10 mg total) by mouth daily., Disp: 90 tablet, Rfl: 1   Patient Care Team: Paulene Floor as PCP - General (Physician Assistant)    Objective:    Vitals: BP 116/73 (BP Location: Left Arm, Patient Position:  Sitting, Cuff Size: Large)   Pulse 65   Temp (!) 96.9 F (36.1 C) (Temporal)   Resp 16   Ht 5\' 5"  (1.651 m)   Wt 185 lb (83.9 kg)   BMI 30.79 kg/m    Vitals:   08/03/19 1012  BP: 116/73  Pulse: 65  Resp: 16  Temp: (!) 96.9 F (36.1 C)  TempSrc: Temporal  Weight: 185 lb (83.9 kg)  Height: 5\' 5"  (1.651 m)     Physical Exam Constitutional:      Appearance: Normal appearance. She is obese.  Cardiovascular:     Rate and Rhythm: Normal rate and regular rhythm.     Heart sounds: Normal heart sounds.  Pulmonary:     Effort: Pulmonary effort is normal.     Breath sounds: Normal breath sounds.  Chest:     Breasts:        Right: Normal.        Left: Normal.  Abdominal:     General: Bowel sounds are normal.     Palpations: Abdomen is soft.  Skin:    General: Skin is warm and dry.  Neurological:     Mental Status: She is alert and oriented to person, place, and time. Mental status is at baseline.  Psychiatric:        Behavior: Behavior normal.        Thought Content: Thought content normal.      Depression Screen PHQ 2/9 Scores 08/03/2019 12/13/2017  PHQ - 2 Score 1 0  PHQ- 9 Score 2 0       Assessment & Plan:     Routine Health Maintenance and Physical Exam  Exercise Activities and Dietary recommendations Goals   None     Immunization History  Administered Date(s) Administered  . Influenza,inj,Quad PF,6+ Mos 07/26/2018  . Pneumococcal Polysaccharide-23 01/11/2018  . Tdap 01/11/2018    Health Maintenance  Topic Date Due  . MAMMOGRAM  10/23/2010  .  OPHTHALMOLOGY EXAM  02/18/2019  . HEMOGLOBIN A1C  02/01/2020  . FOOT EXAM  05/01/2020  . URINE MICROALBUMIN  05/01/2020  . Fecal DNA (Cologuard)  02/16/2021  . TETANUS/TDAP  01/12/2028  . PNEUMOCOCCAL POLYSACCHARIDE VACCINE AGE 57-64 HIGH RISK  Completed  . Hepatitis C Screening  Completed  . HIV Screening  Completed     Discussed health benefits of physical activity, and encouraged her to engage in regular exercise appropriate for her age and condition.    1. Annual physical exam   2. Hypertension associated with diabetes ([place])  - CBC with Differential/Platelet - lisinopril (ZESTRIL) 10 MG tablet; Take 1 tablet (10 mg total) by mouth daily.  Dispense: 90 tablet; Refill: 0  3. Type 2 diabetes mellitus with hyperosmolarity without coma, unspecified whether long term insulin use (H[facility])  Due for eye exam. Likely has some peripheral neuropathy in her hands based on her description of numbness in her fingertips.   Increased farxiga to 10 mg daily. After one month, add glipizide xL 5 mg with breakfast. If after another month sugars are still uncontrolled, increase glipizde to 10 mg in the morning.   - Comprehensive metabolic panel - Lipid Panel With LDL/HDL Ratio - POCT glycosylated hemoglobin (Hb A1C) - dapagliflozin propanediol (FARXIGA) 10 MG TABS tablet; Take 10 mg by mouth daily before breakfast.  Dispense: 90 tablet; Refill: 0 - glipiZIDE (GLUCOTROL XL) 5 MG 24 hr tablet; Take 1 tablet (5 mg total) by mouth daily with breakfast.  Dispense: 90 tablet; Refill: 0  4. Hyperlipidemia, unspecified hyperlipidemia type  Continue statin.  5. Thyroid disorder screening  - TSH  6. Tobacco use disorder  I have counseled > 3 min about smoking cessation and we have agreed to restart wellbutrin.  - buPROPion (WELLBUTRIN SR) 150 MG 12 hr tablet; One tablet daily for three days. Then one tablet twice daily onward.  Dispense: 180 tablet; Refill: 0  7. Left arm pain  I think this is a  combination of arthritis in her neck and possible nerve impingement. She is taking 600 mg -800 mg ibuprofen daily. Counseled this is not advised to take daily. Cannot do oral prednisone due to diabetes. We will try gabapentin. Also counseled on seeing physiatry for interventions like injections.   - gabapentin (NEURONTIN) 300 MG capsule; Take 1 capsule (300 mg total) by mouth 3 (three) times daily.  Dispense: 90 capsule; Refill: 1  --------------------------------------------------------------------    Trinna Post, PA-C  [facility]

## 2019-08-04 LAB — LIPID PANEL WITH LDL/HDL RATIO
Cholesterol, Total: 172 mg/dL (ref 100–199)
HDL: 42 mg/dL (ref >39)
LDL Chol Calc (NIH): 84 mg/dL (ref 0–99)
LDL/HDL Ratio: 2 ratio (ref 0.0–3.2)
Triglycerides: 281 mg/dL — ABNORMAL HIGH (ref 0–149)
VLDL Cholesterol Cal: 46 mg/dL — ABNORMAL HIGH (ref 5–40)

## 2019-08-04 LAB — CBC WITH DIFFERENTIAL/PLATELET
Basophils Absolute: 0 10*3/uL (ref 0.0–0.2)
Basos: 0 %
EOS (ABSOLUTE): 0.3 10*3/uL (ref 0.0–0.4)
Eos: 3 %
Hematocrit: 47.2 % — ABNORMAL HIGH (ref 34.0–46.6)
Hemoglobin: 15.5 g/dL (ref 11.1–15.9)
Immature Grans (Abs): 0 10*3/uL (ref 0.0–0.1)
Immature Granulocytes: 0 %
Lymphocytes Absolute: 4 10*3/uL — ABNORMAL HIGH (ref 0.7–3.1)
Lymphs: 40 %
MCH: 28.8 pg (ref 26.6–33.0)
MCHC: 32.8 g/dL (ref 31.5–35.7)
MCV: 88 fL (ref 79–97)
Monocytes Absolute: 0.7 10*3/uL (ref 0.1–0.9)
Monocytes: 7 %
Neutrophils Absolute: 4.9 10*3/uL (ref 1.4–7.0)
Neutrophils: 50 %
Platelets: 254 10*3/uL (ref 150–450)
RBC: 5.39 x10E6/uL — ABNORMAL HIGH (ref 3.77–5.28)
RDW: 13 % (ref 11.7–15.4)
WBC: 10 10*3/uL (ref 3.4–10.8)

## 2019-08-04 LAB — COMPREHENSIVE METABOLIC PANEL
ALT: 22 IU/L (ref 0–32)
AST: 15 IU/L (ref 0–40)
Albumin/Globulin Ratio: 1.7 (ref 1.2–2.2)
Albumin: 4.6 g/dL (ref 3.8–4.9)
Alkaline Phosphatase: 80 IU/L (ref 39–117)
BUN/Creatinine Ratio: 19 (ref 9–23)
BUN: 16 mg/dL (ref 6–24)
Bilirubin Total: 0.2 mg/dL (ref 0.0–1.2)
CO2: 21 mmol/L (ref 20–29)
Calcium: 10.2 mg/dL (ref 8.7–10.2)
Chloride: 100 mmol/L (ref 96–106)
Creatinine, Ser: 0.84 mg/dL (ref 0.57–1.00)
GFR calc Af Amer: 89 mL/min/{1.73_m2} (ref >59)
GFR calc non Af Amer: 77 mL/min/{1.73_m2} (ref >59)
Globulin, Total: 2.7 g/dL (ref 1.5–4.5)
Glucose: 181 mg/dL — ABNORMAL HIGH (ref 65–99)
Potassium: 4.4 mmol/L (ref 3.5–5.2)
Sodium: 138 mmol/L (ref 134–144)
Total Protein: 7.3 g/dL (ref 6.0–8.5)

## 2019-08-04 LAB — TSH: TSH: 2.19 u[IU]/mL (ref 0.450–4.500)

## 2019-08-07 ENCOUNTER — Telehealth: Payer: Self-pay

## 2019-08-07 NOTE — Telephone Encounter (Signed)
-----   Message from Mar Daring, [place] sent at 08/04/2019  4:58 PM EDT ----- Blood count is stable. Kidney and liver function are normal. Sugar was elevated which is consistent with the elevated A1c that was noted in the office. Cholesterol is improved compared to previous labs. Thyroid is normal.

## 2019-08-07 NOTE — Telephone Encounter (Signed)
Viewed by Fuller Mandril on 08/04/2019 6:25 PM

## 2019-10-02 DIAGNOSIS — E119 Type 2 diabetes mellitus without complications: Secondary | ICD-10-CM | POA: Diagnosis not present

## 2019-10-02 LAB — HM DIABETES EYE EXAM

## 2019-10-09 ENCOUNTER — Other Ambulatory Visit: Payer: Self-pay | Admitting: Physician Assistant

## 2019-10-09 DIAGNOSIS — M79602 Pain in left arm: Secondary | ICD-10-CM

## 2019-11-01 ENCOUNTER — Other Ambulatory Visit: Payer: Self-pay | Admitting: Physician Assistant

## 2019-11-01 DIAGNOSIS — E1100 Type 2 diabetes mellitus with hyperosmolarity without nonketotic hyperglycemic-hyperosmolar coma (NKHHC): Secondary | ICD-10-CM

## 2019-11-01 DIAGNOSIS — E1159 Type 2 diabetes mellitus with other circulatory complications: Secondary | ICD-10-CM

## 2019-11-01 DIAGNOSIS — I152 Hypertension secondary to endocrine disorders: Secondary | ICD-10-CM

## 2019-11-01 NOTE — Telephone Encounter (Signed)
Requested medication (s) are due for refill today: yes  Requested medication (s) are on the active medication list:  yes  Last refill:  08/01/2019  Future visit scheduled: yes  Notes to clinic:  patient has appointment on 11/03/2019 Review for refill   Requested Prescriptions  Pending Prescriptions Disp Refills   lisinopril (ZESTRIL) 10 MG tablet [Pharmacy Med Name: LISINOPRIL 10 MG TAB[*]] 90 tablet 1    Sig: TAKE ONE TABLET BY MOUTH ONE TIME DAILY      Cardiovascular:  ACE Inhibitors Failed - 11/01/2019  2:19 AM      Failed - Valid encounter within last 6 months    Recent Outpatient Visits           3 months ago Annual physical exam   [facility] Terrilee Croak, Adriana M, PA-C   6 months ago Type 2 diabetes mellitus with hyperosmolarity without coma, unspecified whether long term insulin use ([place])   [place], [place], PA-C   9 months ago Type 2 diabetes mellitus with hyperosmolarity without coma, unspecified whether long term insulin use [facility])   [facility] [place], [place], PA-C   1 year ago Type 2 diabetes mellitus with hyperosmolarity without coma, unspecified whether long term insulin use [facility])   [facility] [place], [place], PA-C   1 year ago Type 2 diabetes mellitus with hyperosmolarity without coma, unspecified whether long term insulin use [facility])   [place], Wendee Beavers, [place]       Future Appointments             In 2 days Trinna Post, PA-C [employer]            Passed - Cr in normal range and within 180 days    Creatinine, Ser  Date Value Ref Range Status  08/03/2019 0.84 0.57 - 1.00 mg/dL Final          Passed - K in normal range and within 180 days    Potassium  Date Value Ref Range Status  08/03/2019 4.4 3.5 - 5.2 mmol/L Final          Passed - Patient is not pregnant      Passed - Last BP in normal range    BP Readings from Last  1 Encounters:  08/03/19 116/73            metFORMIN (GLUCOPHAGE) 1000 MG tablet [Pharmacy Med Name: METFORMIN 1000 MG TAB] 180 tablet 1    Sig: TAKE ONE TABLET BY MOUTH TWICE A DAY WITH A MEAL      Endocrinology:  Diabetes - Biguanides Failed - 11/01/2019  2:19 AM      Failed - HBA1C is between 0 and 7.9 and within 180 days    Hemoglobin A1C  Date Value Ref Range Status  08/03/2019 9.2 (A) 4.0 - 5.6 % Final   Hgb A1c MFr Bld  Date Value Ref Range Status  05/02/2019 8.7 (H) 4.8 - 5.6 % Final    Comment:             Prediabetes: 5.7 - 6.4          Diabetes: >6.4          Glycemic control for adults with diabetes: <7.0           Failed - Valid encounter within last 6 months    Recent Outpatient Visits  3 months ago Annual physical exam   [facility] Carles Collet M, PA-C   6 months ago Type 2 diabetes mellitus with hyperosmolarity without coma, unspecified whether long term insulin use [facility])   [place], [place], [place]   9 months ago Type 2 diabetes mellitus with hyperosmolarity without coma, unspecified whether long term insulin use [facility])   [facility] [place], [place] M, PA-C   1 year ago Type 2 diabetes mellitus with hyperosmolarity without coma, unspecified whether long term insulin use [facility])   [facility] [place], [place], PA-C   1 year ago Type 2 diabetes mellitus with hyperosmolarity without coma, unspecified whether long term insulin use [facility])   [place], Wendee Bea[place], [place]       Future Appointments             In 2 days Trinna Post, PA-C [employer]            Passed - Cr in normal range and within 360 days    Creatinine, Ser  Date Value Ref Range Status  08/03/2019 0.84 0.57 - 1.00 mg/dL Final          Passed - eGFR in normal range and within 360 days    GFR calc Af Amer  Date Value Ref Range Status  08/03/2019 89 >59  mL/min/1.73 Final   GFR calc non Af Amer  Date Value Ref Range Status  08/03/2019 77 >59 mL/min/1.73 Final

## 2019-11-02 NOTE — Progress Notes (Signed)
Patient: Stacie Fleming Female    DOB: [birth year]   59 y.o.   MRN: [id number] Visit Date: 11/02/2019  Today's Provider: Trinna Post, PA-C   Chief Complaint  Patient presents with  . Diabetes Mellitus   Subjective:     HPI  Diabetes Mellitus Type II, Follow-up:   Lab Results  Component Value Date   HGBA1C 9.2 (A) 08/03/2019   HGBA1C 8.7 (H) 05/02/2019   HGBA1C 7.3 (A) 10/25/2018    Last seen for diabetes 3 months ago.  Management since then includes increased Farxiga 10 MG daily, then after one month add Glipizide xL 5 MG with breakfast. She reports fair compliance with treatment. She is not having side effects.  Current symptoms include none and have been stable. Home blood sugar records: not checking  Episodes of hypoglycemia? no   Current insulin regiment: Is not on insulin Most Recent Eye Exam: 10/02/2019 Weight trend: stable Prior visit with dietician: No Current exercise: housecleaning, walking and yard work Current diet habits: well balanced  Pertinent Labs:    Component Value Date/Time   CHOL 172 08/03/2019 1058   TRIG 281 (H) 08/03/2019 1058   HDL 42 08/03/2019 1058   LDLCALC 84 08/03/2019 1058   CREATININE 0.84 08/03/2019 1058    Wt Readings from Last 3 Encounters:  08/03/19 185 lb (83.9 kg)  05/02/19 186 lb 12.8 oz (84.7 kg)  10/25/18 185 lb (83.9 kg)    ------------------------------------------------------------------------    Allergies  Allergen Reactions  . Other Other (See Comments)    Per patient  . Neosporin [Neomycin-Bacitracin Zn-Polymyx]   . Tape      Current Outpatient Medications:  .  amLODipine (NORVASC) 10 MG tablet, Take 1 tablet (10 mg total) by mouth daily., Disp: 90 tablet, Rfl: 1 .  atorvastatin (LIPITOR) 20 MG tablet, Take 1 tablet (20 mg total) by mouth daily., Disp: 90 tablet, Rfl: 1 .  buPROPion (WELLBUTRIN SR) 150 MG 12 hr tablet, One tablet daily for three days. Then one tablet twice daily onward.,  Disp: 180 tablet, Rfl: 0 .  dapagliflozin propanediol (FARXIGA) 10 MG TABS tablet, Take 10 mg by mouth daily before breakfast., Disp: 90 tablet, Rfl: 0 .  gabapentin (NEURONTIN) 300 MG capsule, TAKE ONE CAPSULE BY MOUTH THREE TIMES A DAY, Disp: 90 capsule, Rfl: 1 .  glipiZIDE (GLUCOTROL XL) 5 MG 24 hr tablet, Take 1 tablet (5 mg total) by mouth daily with breakfast., Disp: 90 tablet, Rfl: 0 .  ibuprofen (ADVIL,MOTRIN) 200 MG tablet, Take 200 mg by mouth every 6 (six) hours as needed., Disp: , Rfl:  .  lisinopril (ZESTRIL) 10 MG tablet, TAKE ONE TABLET BY MOUTH ONE TIME DAILY, Disp: 90 tablet, Rfl: 1 .  metFORMIN (GLUCOPHAGE) 1000 MG tablet, TAKE ONE TABLET BY MOUTH TWICE A DAY WITH A MEAL, Disp: 180 tablet, Rfl: 1  Review of Systems  Constitutional: Negative.   Respiratory: Negative.   Cardiovascular: Negative.   Neurological: Negative.   Hematological: Negative.     Social History   Tobacco Use  . Smoking status: Current Some Day Smoker    Packs/day: 0.50    Years: 40.00    Pack years: 20.00    Types: Cigarettes  . Smokeless tobacco: Never Used  Substance Use Topics  . Alcohol use: No      Objective:   There were no vitals taken for this visit. There were no vitals filed for this visit.There is no height or  weight on file to calculate BMI.   Physical Exam Constitutional:      Appearance: Normal appearance.  Cardiovascular:     Rate and Rhythm: Normal rate and regular rhythm.     Heart sounds: Normal heart sounds.  Pulmonary:     Effort: Pulmonary effort is normal.     Breath sounds: Normal breath sounds.  Skin:    General: Skin is warm and dry.  Neurological:     Mental Status: She is alert and oriented to person, place, and time. Mental status is at baseline.  Psychiatric:        Mood and Affect: Mood normal.        Behavior: Behavior normal.      No results found for any visits on 11/03/19.     Assessment & Plan    1. Type 2 diabetes mellitus with  hyperosmolarity without coma, unspecified whether long term insulin use (HCC)  We will do 10 mg glipizide for one month and then increase to 20 mg glipizide daily in addition to her other DM medications. A1c decreased from 9.2% to 8.5%.  - POCT HgB A1C - glipiZIDE (GLUCOTROL XL) 10 MG 24 hr tablet; Take 1 tablet (10 mg total) by mouth daily with breakfast for 30 days, THEN 2 tablets (20 mg total) daily with breakfast.  Dispense: 150 tablet; Refill: 0  2. Hypertension associated with diabetes ([place])  Continue current medications.  3. Hyperlipidemia, unspecified hyperlipidemia type  Continue current medications.  The entirety of the information documented in the History of Present Illness, Review of Systems and Physical Exam were personally obtained by me. Portions of this information were initially documented by April M. Sabra Heck, CMA and reviewed by me for thoroughness and accuracy.        Trinna Post, PA-C  [facility]

## 2019-11-03 ENCOUNTER — Encounter: Payer: Self-pay | Admitting: Physician Assistant

## 2019-11-03 ENCOUNTER — Ambulatory Visit (INDEPENDENT_AMBULATORY_CARE_PROVIDER_SITE_OTHER): Payer: BC Managed Care – PPO | Admitting: Physician Assistant

## 2019-11-03 VITALS — BP 133/82 | HR 72 | Temp 96.9°F | Wt 188.6 lb

## 2019-11-03 DIAGNOSIS — I10 Essential (primary) hypertension: Secondary | ICD-10-CM | POA: Diagnosis not present

## 2019-11-03 DIAGNOSIS — E1159 Type 2 diabetes mellitus with other circulatory complications: Secondary | ICD-10-CM | POA: Diagnosis not present

## 2019-11-03 DIAGNOSIS — E785 Hyperlipidemia, unspecified: Secondary | ICD-10-CM | POA: Diagnosis not present

## 2019-11-03 DIAGNOSIS — E1100 Type 2 diabetes mellitus with hyperosmolarity without nonketotic hyperglycemic-hyperosmolar coma (NKHHC): Secondary | ICD-10-CM | POA: Diagnosis not present

## 2019-11-03 DIAGNOSIS — I152 Hypertension secondary to endocrine disorders: Secondary | ICD-10-CM

## 2019-11-03 LAB — POCT GLYCOSYLATED HEMOGLOBIN (HGB A1C)
Estimated Average Glucose: 197
Hemoglobin A1C: 8.5 % — AB (ref 4.0–5.6)

## 2019-11-03 MED ORDER — GLIPIZIDE ER 10 MG PO TB24: ORAL_TABLET | ORAL | 0 refills | Status: DC

## 2019-11-03 NOTE — Patient Instructions (Signed)
Diabetes Mellitus and Exercise Exercising regularly is important for your overall health, especially when you have diabetes (diabetes mellitus). Exercising is not only about losing weight. It has many other health benefits, such as increasing muscle strength and bone density and reducing body fat and stress. This leads to improved fitness, flexibility, and endurance, all of which result in better overall health. Exercise has additional benefits for people with diabetes, including:  Reducing appetite.  Helping to lower and control blood glucose.  Lowering blood pressure.  Helping to control amounts of fatty substances (lipids) in the blood, such as cholesterol and triglycerides.  Helping the body to respond better to insulin (improving insulin sensitivity).  Reducing how much insulin the body needs.  Decreasing the risk for heart disease by: ? Lowering cholesterol and triglyceride levels. ? Increasing the levels of good cholesterol. ? Lowering blood glucose levels. What is my activity plan? Your health care provider or certified diabetes educator can help you make a plan for the type and frequency of exercise (activity plan) that works for you. Make sure that you:  Do at least 150 minutes of moderate-intensity or vigorous-intensity exercise each week. This could be brisk walking, biking, or water aerobics. ? Do stretching and strength exercises, such as yoga or weightlifting, at least 2 times a week. ? Spread out your activity over at least 3 days of the week.  Get some form of physical activity every day. ? Do not go more than 2 days in a row without some kind of physical activity. ? Avoid being inactive for more than 30 minutes at a time. Take frequent breaks to walk or stretch.  Choose a type of exercise or activity that you enjoy, and set realistic goals.  Start slowly, and gradually increase the intensity of your exercise over time. What do I need to know about managing my  diabetes?   Check your blood glucose before and after exercising. ? If your blood glucose is 240 mg/dL (13.3 mmol/L) or higher before you exercise, check your urine for ketones. If you have ketones in your urine, do not exercise until your blood glucose returns to normal. ? If your blood glucose is 100 mg/dL (5.6 mmol/L) or lower, eat a snack containing 15-20 grams of carbohydrate. Check your blood glucose 15 minutes after the snack to make sure that your level is above 100 mg/dL (5.6 mmol/L) before you start your exercise.  Know the symptoms of low blood glucose (hypoglycemia) and how to treat it. Your risk for hypoglycemia increases during and after exercise. Common symptoms of hypoglycemia can include: ? Hunger. ? Anxiety. ? Sweating and feeling clammy. ? Confusion. ? Dizziness or feeling light-headed. ? Increased heart rate or palpitations. ? Blurry vision. ? Tingling or numbness around the mouth, lips, or tongue. ? Tremors or shakes. ? Irritability.  Keep a rapid-acting carbohydrate snack available before, during, and after exercise to help prevent or treat hypoglycemia.  Avoid injecting insulin into areas of the body that are going to be exercised. For example, avoid injecting insulin into: ? The arms, when playing tennis. ? The legs, when jogging.  Keep records of your exercise habits. Doing this can help you and your health care provider adjust your diabetes management plan as needed. Write down: ? Food that you eat before and after you exercise. ? Blood glucose levels before and after you exercise. ? The type and amount of exercise you have done. ? When your insulin is expected to peak, if you use   insulin. Avoid exercising at times when your insulin is peaking.  When you start a new exercise or activity, work with your health care provider to make sure the activity is safe for you, and to adjust your insulin, medicines, or food intake as needed.  Drink plenty of water while  you exercise to prevent dehydration or heat stroke. Drink enough fluid to keep your urine clear or pale yellow. Summary  Exercising regularly is important for your overall health, especially when you have diabetes (diabetes mellitus).  Exercising has many health benefits, such as increasing muscle strength and bone density and reducing body fat and stress.  Your health care provider or certified diabetes educator can help you make a plan for the type and frequency of exercise (activity plan) that works for you.  When you start a new exercise or activity, work with your health care provider to make sure the activity is safe for you, and to adjust your insulin, medicines, or food intake as needed. This information is not intended to replace advice given to you by your health care provider. Make sure you discuss any questions you have with your health care provider. Document Revised: 04/29/2017 Document Reviewed: 03/16/2016 Elsevier Patient Education  2020 Elsevier Inc.  

## 2019-11-09 ENCOUNTER — Other Ambulatory Visit: Payer: Self-pay | Admitting: Physician Assistant

## 2019-11-09 DIAGNOSIS — E1100 Type 2 diabetes mellitus with hyperosmolarity without nonketotic hyperglycemic-hyperosmolar coma (NKHHC): Secondary | ICD-10-CM

## 2019-11-09 DIAGNOSIS — E1159 Type 2 diabetes mellitus with other circulatory complications: Secondary | ICD-10-CM

## 2019-11-09 DIAGNOSIS — I152 Hypertension secondary to endocrine disorders: Secondary | ICD-10-CM

## 2019-11-10 ENCOUNTER — Other Ambulatory Visit: Payer: Self-pay | Admitting: Physician Assistant

## 2019-11-10 DIAGNOSIS — E1100 Type 2 diabetes mellitus with hyperosmolarity without nonketotic hyperglycemic-hyperosmolar coma (NKHHC): Secondary | ICD-10-CM

## 2019-11-23 ENCOUNTER — Other Ambulatory Visit: Payer: Self-pay | Admitting: Physician Assistant

## 2019-11-23 DIAGNOSIS — E785 Hyperlipidemia, unspecified: Secondary | ICD-10-CM

## 2019-11-28 ENCOUNTER — Other Ambulatory Visit: Payer: Self-pay | Admitting: Physician Assistant

## 2019-11-28 DIAGNOSIS — E785 Hyperlipidemia, unspecified: Secondary | ICD-10-CM

## 2019-11-28 NOTE — Telephone Encounter (Signed)
Requested Prescriptions  Pending Prescriptions Disp Refills  . atorvastatin (LIPITOR) 20 MG tablet [Pharmacy Med Name: ATORVASTATIN 20 MG TAB[*]] 90 tablet 1    Sig: TAKE ONE TABLET BY MOUTH ONE TIME DAILY     Cardiovascular:  Antilipid - Statins Failed - 11/28/2019 11:06 AM      Failed - LDL in normal range and within 360 days    LDL Chol Calc (NIH)  Date Value Ref Range Status  08/03/2019 84 0 - 99 mg/dL Final   LDL Direct  Date Value Ref Range Status  07/26/2018 84 0 - 99 mg/dL Final         Failed - Triglycerides in normal range and within 360 days    Triglycerides  Date Value Ref Range Status  08/03/2019 281 (H) 0 - 149 mg/dL Final         Passed - Total Cholesterol in normal range and within 360 days    Cholesterol, Total  Date Value Ref Range Status  08/03/2019 172 100 - 199 mg/dL Final         Passed - HDL in normal range and within 360 days    HDL  Date Value Ref Range Status  08/03/2019 42 >39 mg/dL Final         Passed - Patient is not pregnant      Passed - Valid encounter within last 12 months    Recent Outpatient Visits          3 weeks ago Type 2 diabetes mellitus with hyperosmolarity without coma, unspecified whether long term insulin use [facility])   [place], Adriana M, PA-C   3 months ago Annual physical exam   [employer], Adriana M, PA-C   7 months ago Type 2 diabetes mellitus with hyperosmolarity without coma, unspecified whether long term insulin use [facility])   [place], [place], PA-C   10 months ago Type 2 diabetes mellitus with hyperosmolarity without coma, unspecified whether long term insulin use [facility])   [facility] [place], [place], PA-C   1 year ago Type 2 diabetes mellitus with hyperosmolarity without coma, unspecified whether long term insulin use [facility])   [facility] Trinna Post, [place]      Future Appointments            In 2 months  Trinna Post, PA-C [employer]

## 2019-12-08 ENCOUNTER — Other Ambulatory Visit: Payer: Self-pay | Admitting: Physician Assistant

## 2019-12-08 DIAGNOSIS — M79602 Pain in left arm: Secondary | ICD-10-CM

## 2020-01-31 NOTE — Progress Notes (Signed)
Established patient visit     Patient: Stacie Fleming   DOB: [birth year]   59 y.o. Female  MRN: [id number] Visit Date: 02/01/2020  Today's healthcare provider: Trinna Post, PA-C  Subjective:     Mertie Moores as a scribe for Trinna Post, PA-C.,have documented all relevant documentation on the behalf of Trinna Post, PA-C,as directed by  Trinna Post, PA-C while in the presence of Trinna Post, PA-C.  Chief Complaint  Patient presents with  . Diabetes  . Hypertension  . Hyperlipidemia   Leg Pain  Incident onset: couple months  There was no injury mechanism. The pain is present in the left foot. The quality of the pain is described as aching. The pain has been worsening since onset. She reports no foreign bodies present. The symptoms are aggravated by weight bearing. She has tried nothing for the symptoms.   Diabetes Mellitus Type II, Follow-up  Lab Results  Component Value Date   HGBA1C 8.2 (A) 02/01/2020   HGBA1C 8.5 (A) 11/03/2019   HGBA1C 9.2 (A) 08/03/2019   Last seen for diabetes 3 months ago.  Management since then includes started Glipizide 10 mg for 1 month  then increase to 20 mg and continue other DM medications. She reports good compliance with treatment. She is not having side effects.   Home blood sugar records: fasting range: checked occasionally   Episodes of hypoglycemia? No    Current insulin regiment: none  Most Recent Eye Exam: 10/02/2019  -------------------------------------------------------------------------------------------------------------  Hypertension, follow-up  BP Readings from Last 3 Encounters:  02/01/20 137/80  11/03/19 133/82  08/03/19 116/73   She was last seen for hypertension 3 months ago.  BP at that visit was 133/82. Management since that visit includes no change. She reports good compliance with treatment. She is not having side effects.  She is not exercising. She is adherent to low  salt diet.   Outside blood pressures are not being checked at home.  She does smoke.  Use of agents associated with hypertension: none.   ---------------------------------------------------------------------------------------------------------------------- Lipid/Cholesterol, Follow-up  Last Lipid Panel:    Component Value Date/Time   CHOL 172 08/03/2019 1058   TRIG 281 (H) 08/03/2019 1058   HDL 42 08/03/2019 1058   CHOLHDL 5.6 (H) 07/26/2018 1100   LDLCALC 84 08/03/2019 1058   LDLDIRECT 84 07/26/2018 1100    She was last seen for this 3 months ago.  Management since that visit includes no change.  She reports good compliance with treatment. She is not having side effects.  Symptoms: No appetite changes No foot ulcerations No chest pain No chest pressure/discomfort No dyspnea No fatigue No lower extremity edema No nausea No numbness or tingling of extremity No orthopnea No palpitations No paroxysmal nocturnal dyspnea No polydipsia No polyuria No speech difficulty No syncope No visual disturbances   She is following a Regular diet. Current exercise: none  Wt Readings from Last 3 Encounters:  02/01/20 189 lb (85.7 kg)  11/03/19 188 lb 9.6 oz (85.5 kg)  08/03/19 185 lb (83.9 kg)   Last metabolic panel Lab Results  Component Value Date   GLUCOSE 181 (H) 08/03/2019   NA 138 08/03/2019   K 4.4 08/03/2019   CL 100 08/03/2019   CO2 21 08/03/2019   BUN 16 08/03/2019   CREATININE 0.84 08/03/2019   GFRNONAA 77 08/03/2019   GFRAA 89 08/03/2019   CALCIUM 10.2 08/03/2019   PROT 7.3 08/03/2019   ALBUMIN 4.6 08/03/2019  LABGLOB 2.7 08/03/2019   AGRATIO 1.7 08/03/2019   BILITOT 0.2 08/03/2019   ALKPHOS 80 08/03/2019   AST 15 08/03/2019   ALT 22 08/03/2019   The 10-year ASCVD risk score Mikey Bussing DC Jr., et al., 2013) is: 19.2%  -------------------------------------------------------------------------------------------------------------        Medications: Outpatient Medications Prior to Visit  Medication Sig  . amLODipine (NORVASC) 10 MG tablet Take 1 tablet (10 mg total) by mouth daily.  Marland Kitchen atorvastatin (LIPITOR) 20 MG tablet TAKE ONE TABLET BY MOUTH ONE TIME DAILY  . FARXIGA 10 MG TABS tablet TAKE ONE TABLET BY MOUTH ONE TIME DAILY BEFORE BREAKFAST  . gabapentin (NEURONTIN) 300 MG capsule TAKE ONE CAPSULE BY MOUTH THREE TIMES A DAY  . glipiZIDE (GLUCOTROL XL) 10 MG 24 hr tablet Take 1 tablet (10 mg total) by mouth daily with breakfast for 30 days, THEN 2 tablets (20 mg total) daily with breakfast.  . ibuprofen (ADVIL,MOTRIN) 200 MG tablet Take 200 mg by mouth every 6 (six) hours as needed.  Marland Kitchen lisinopril (ZESTRIL) 10 MG tablet TAKE ONE TABLET BY MOUTH ONE TIME DAILY  . metFORMIN (GLUCOPHAGE) 1000 MG tablet TAKE ONE TABLET BY MOUTH TWICE A DAY WITH A MEAL  . buPROPion (WELLBUTRIN SR) 150 MG 12 hr tablet One tablet daily for three days. Then one tablet twice daily onward. (Patient not taking: Reported on 11/03/2019)   No facility-administered medications prior to visit.    Review of Systems  Constitutional: Positive for fatigue.  Respiratory: Negative.   Cardiovascular: Negative.   Genitourinary: Negative.   Neurological: Positive for dizziness.  Hematological: Negative.         Objective:    BP 137/80 (BP Location: Left Arm, Patient Position: Sitting, Cuff Size: Large)   Pulse 69   Temp (!) 96.8 F (36 C) (Temporal)   Wt 189 lb (85.7 kg)   BMI 31.45 kg/m    Physical Exam Constitutional:      Appearance: Normal appearance.  Cardiovascular:     Rate and Rhythm: Normal rate and regular rhythm.     Pulses: Normal pulses.     Heart sounds: Normal heart sounds.  Pulmonary:     Effort: Pulmonary effort is normal.     Breath sounds: Normal breath sounds.  Skin:    General: Skin is warm and dry.  Neurological:     Mental Status: She is alert and oriented to person, place, and time. Mental status is at baseline.   Psychiatric:        Mood and Affect: Mood normal.        Behavior: Behavior normal.       Results for orders placed or performed in visit on 02/01/20  POCT HgB A1C  Result Value Ref Range   Hemoglobin A1C 8.2 (A) 4.0 - 5.6 %      Assessment & Plan:    1. Type 2 diabetes mellitus with hyperosmolarity without coma, unspecified whether long term insulin use (HCC) Uncontrolled with last A1c 8.2 Starting Januvia 100 mg daily  UTD on vaccines, eye exam, foot exam On ACEi On Statin Discussed diet and exercise F/u in 3 months  - POCT HgB A1C - sitaGLIPtin (JANUVIA) 100 MG tablet; Take 1 tablet (100 mg total) by mouth daily.  Dispense: 90 tablet; Refill: 1  2. Hypertension associated with diabetes ([place]) Well controlled Continue current medications Recheck metabolic panel at next visit F/u in 3 months  3. Hyperlipidemia, unspecified hyperlipidemia type Previously well controlled Continue statin Repeat  FLP and CMP Goal LDL < 70   4. Breast cancer screening by mammogram  - MM 3D Screening Breast Bilateral - [place]; Future  5. Left foot pain Foot xray depending on results may need Ortho referral - DG Foot 2 Views Left; Future   Return in about 3 months (around 05/02/2020).      Paulene Floor  [facility] [phone] (phone) [phone] (fax)  [place]

## 2020-02-01 ENCOUNTER — Other Ambulatory Visit: Payer: Self-pay

## 2020-02-01 ENCOUNTER — Encounter: Payer: Self-pay | Admitting: Physician Assistant

## 2020-02-01 ENCOUNTER — Ambulatory Visit
Admission: RE | Admit: 2020-02-01 | Discharge: 2020-02-01 | Disposition: A | Discharge status: Home / Self Care | Payer: BC Managed Care – PPO | Source: Ambulatory Visit | Attending: Physician Assistant | Admitting: Physician Assistant

## 2020-02-01 ENCOUNTER — Ambulatory Visit (INDEPENDENT_AMBULATORY_CARE_PROVIDER_SITE_OTHER): Payer: BC Managed Care – PPO | Admitting: Physician Assistant

## 2020-02-01 VITALS — BP 137/80 | HR 69 | Temp 96.8°F | Wt 189.0 lb

## 2020-02-01 DIAGNOSIS — M79672 Pain in left foot: Secondary | ICD-10-CM

## 2020-02-01 DIAGNOSIS — I152 Hypertension secondary to endocrine disorders: Secondary | ICD-10-CM

## 2020-02-01 DIAGNOSIS — E1159 Type 2 diabetes mellitus with other circulatory complications: Secondary | ICD-10-CM

## 2020-02-01 DIAGNOSIS — E1100 Type 2 diabetes mellitus with hyperosmolarity without nonketotic hyperglycemic-hyperosmolar coma (NKHHC): Secondary | ICD-10-CM

## 2020-02-01 DIAGNOSIS — Z1231 Encounter for screening mammogram for malignant neoplasm of breast: Secondary | ICD-10-CM

## 2020-02-01 DIAGNOSIS — E785 Hyperlipidemia, unspecified: Secondary | ICD-10-CM

## 2020-02-01 DIAGNOSIS — I10 Essential (primary) hypertension: Secondary | ICD-10-CM

## 2020-02-01 DIAGNOSIS — M19072 Primary osteoarthritis, left ankle and foot: Secondary | ICD-10-CM | POA: Diagnosis not present

## 2020-02-01 LAB — POCT GLYCOSYLATED HEMOGLOBIN (HGB A1C): Hemoglobin A1C: 8.2 % — AB (ref 4.0–5.6)

## 2020-02-01 MED ORDER — SITAGLIPTIN PHOSPHATE 100 MG PO TABS: 100.0000 mg | ORAL_TABLET | Freq: Every day | ORAL | 1 refills | Status: DC

## 2020-02-02 ENCOUNTER — Other Ambulatory Visit: Payer: Self-pay | Admitting: Physician Assistant

## 2020-02-02 DIAGNOSIS — I152 Hypertension secondary to endocrine disorders: Secondary | ICD-10-CM

## 2020-02-02 DIAGNOSIS — E1159 Type 2 diabetes mellitus with other circulatory complications: Secondary | ICD-10-CM

## 2020-02-06 ENCOUNTER — Other Ambulatory Visit: Payer: Self-pay | Admitting: Physician Assistant

## 2020-02-06 DIAGNOSIS — E1100 Type 2 diabetes mellitus with hyperosmolarity without nonketotic hyperglycemic-hyperosmolar coma (NKHHC): Secondary | ICD-10-CM

## 2020-02-06 DIAGNOSIS — E1159 Type 2 diabetes mellitus with other circulatory complications: Secondary | ICD-10-CM

## 2020-02-06 DIAGNOSIS — I152 Hypertension secondary to endocrine disorders: Secondary | ICD-10-CM

## 2020-02-06 NOTE — Telephone Encounter (Signed)
Requested Prescriptions  Pending Prescriptions Disp Refills  . glipiZIDE (GLUCOTROL XL) 10 MG 24 hr tablet [Pharmacy Med Name: GLIPIZIDE ER 10 MG TAB[*]] 150 tablet 0    Sig: TAKE ONE TABLET BY MOUTH ONE TIME DAILY WITH BREAKFAST FOR 30 DAYS THEN INCREASE TO TAKE TWO TABLETS BY MOUTH ONE TIME DAILY WITH BREAKFAST     Endocrinology:  Diabetes - Sulfonylureas Failed - 02/06/2020  1:14 PM      Failed - HBA1C is between 0 and 7.9 and within 180 days    Hemoglobin A1C  Date Value Ref Range Status  02/01/2020 8.2 (A) 4.0 - 5.6 % Final   Hgb A1c MFr Bld  Date Value Ref Range Status  05/02/2019 8.7 (H) 4.8 - 5.6 % Final    Comment:             Prediabetes: 5.7 - 6.4          Diabetes: >6.4          Glycemic control for adults with diabetes: <7.0          Passed - Valid encounter within last 6 months    Recent Outpatient Visits          5 days ago Type 2 diabetes mellitus with hyperosmolarity without coma, unspecified whether long term insulin use [facility])   [place], Adriana M, PA-C   3 months ago Type 2 diabetes mellitus with hyperosmolarity without coma, unspecified whether long term insulin use [facility])   [place], [place], PA-C   6 months ago Annual physical exam   [employer], Adriana M, PA-C   9 months ago Type 2 diabetes mellitus with hyperosmolarity without coma, unspecified whether long term insulin use [facility])   [facility] [place], [place], PA-C   1 year ago Type 2 diabetes mellitus with hyperosmolarity without coma, unspecified whether long term insulin use [facility])   [facility] Trinna Post, [place]      Future Appointments            In 2 months Trinna Post, PA-C [employer], [facility]

## 2020-02-14 ENCOUNTER — Other Ambulatory Visit: Payer: Self-pay | Admitting: Physician Assistant

## 2020-02-14 DIAGNOSIS — E1100 Type 2 diabetes mellitus with hyperosmolarity without nonketotic hyperglycemic-hyperosmolar coma (NKHHC): Secondary | ICD-10-CM

## 2020-02-14 NOTE — Telephone Encounter (Signed)
Requested Prescriptions  Pending Prescriptions Disp Refills  . FARXIGA 10 MG TABS tablet [Pharmacy Med Name: FARXIGA 10 MG TAB[**$]] 90 tablet 0    Sig: TAKE ONE TABLET BY MOUTH ONE TIME DAILY BEFORE BREAKFAST     Endocrinology:  Diabetes - SGLT2 Inhibitors Failed - 02/14/2020 11:57 AM      Failed - LDL in normal range and within 360 days    LDL Chol Calc (NIH)  Date Value Ref Range Status  08/03/2019 84 0 - 99 mg/dL Final   LDL Direct  Date Value Ref Range Status  07/26/2018 84 0 - 99 mg/dL Final         Failed - HBA1C is between 0 and 7.9 and within 180 days    Hemoglobin A1C  Date Value Ref Range Status  02/01/2020 8.2 (A) 4.0 - 5.6 % Final   Hgb A1c MFr Bld  Date Value Ref Range Status  05/02/2019 8.7 (H) 4.8 - 5.6 % Final    Comment:             Prediabetes: 5.7 - 6.4          Diabetes: >6.4          Glycemic control for adults with diabetes: <7.0          Passed - Cr in normal range and within 360 days    Creatinine, Ser  Date Value Ref Range Status  08/03/2019 0.84 0.57 - 1.00 mg/dL Final         Passed - eGFR in normal range and within 360 days    GFR calc Af Amer  Date Value Ref Range Status  08/03/2019 89 >59 mL/min/1.73 Final   GFR calc non Af Amer  Date Value Ref Range Status  08/03/2019 77 >59 mL/min/1.73 Final         Passed - Valid encounter within last 6 months    Recent Outpatient Visits          1 week ago Type 2 diabetes mellitus with hyperosmolarity without coma, unspecified whether long term insulin use [facility])   [place], Adriana M, PA-C   3 months ago Type 2 diabetes mellitus with hyperosmolarity without coma, unspecified whether long term insulin use [facility])   [place], [place], PA-C   6 months ago Annual physical exam   [employer], Adriana M, PA-C   9 months ago Type 2 diabetes mellitus with hyperosmolarity without coma, unspecified whether long term insulin use  [facility])   [facility] [place], [place], PA-C   1 year ago Type 2 diabetes mellitus with hyperosmolarity without coma, unspecified whether long term insulin use [facility])   [place], Wendee Beavers, [place]      Future Appointments            In 2 months Terrilee Croak, Wend[place], PA-C [employer], [facility]

## 2020-05-01 NOTE — Progress Notes (Signed)
Established patient visit   Patient: Stacie Fleming   DOB: 07/21/1961   59 y.o. Female  MRN: [id number] Visit Date: 05/02/2020  Today's healthcare provider: Trinna Post, PA-C   Chief Complaint  Patient presents with  . Diabetes  I,Porsha C McClurkin,acting as a scribe for [employer], PA-C.,have documented all relevant documentation on the behalf of Trinna Post, PA-C,as directed by  Trinna Post, PA-C while in the presence of Trinna Post, PA-C.  Subjective    HPI  Diabetes Mellitus Type II, Follow-up  Lab Results  Component Value Date   HGBA1C 7.5 (A) 05/02/2020   HGBA1C 8.2 (A) 02/01/2020   HGBA1C 8.5 (A) 11/03/2019   Wt Readings from Last 3 Encounters:  05/02/20 188 lb 6.4 oz (85.5 kg)  02/01/20 189 lb (85.7 kg)  11/03/19 188 lb 9.6 oz (85.5 kg)   Last seen for diabetes 3 months ago.  Management since then includes starting Januvia 100mg . She reports good compliance with treatment. She is not having side effects.  Symptoms: No fatigue No foot ulcerations  No appetite changes No nausea  No paresthesia of the feet  No polydipsia  No polyuria No visual disturbances   No vomiting     Home blood sugar records: 100's-120's  Episodes of hypoglycemia? No    Current insulin regiment: none Most Recent Eye Exam: UTD Current exercise: housecleaning and yard work Current diet habits: well balanced  Pertinent Labs: Lab Results  Component Value Date   CHOL 172 08/03/2019   HDL 42 08/03/2019   LDLCALC 84 08/03/2019   LDLDIRECT 84 07/26/2018   TRIG 281 (H) 08/03/2019   CHOLHDL 5.6 (H) 07/26/2018   Lab Results  Component Value Date   NA 138 08/03/2019   K 4.4 08/03/2019   CREATININE 0.84 08/03/2019   GFRNONAA 77 08/03/2019   GFRAA 89 08/03/2019   GLUCOSE 181 (H) 08/03/2019          Medications: Outpatient Medications Prior to Visit  Medication Sig  . amLODipine (NORVASC) 10 MG tablet TAKE ONE TABLET BY MOUTH ONE TIME DAILY  .  atorvastatin (LIPITOR) 20 MG tablet TAKE ONE TABLET BY MOUTH ONE TIME DAILY  . FARXIGA 10 MG TABS tablet TAKE ONE TABLET BY MOUTH ONE TIME DAILY BEFORE BREAKFAST  . gabapentin (NEURONTIN) 300 MG capsule TAKE ONE CAPSULE BY MOUTH THREE TIMES A DAY  . glipiZIDE (GLUCOTROL XL) 10 MG 24 hr tablet TAKE ONE TABLET BY MOUTH ONE TIME DAILY WITH BREAKFAST FOR 30 DAYS THEN INCREASE TO TAKE TWO TABLETS BY MOUTH ONE TIME DAILY WITH BREAKFAST  . ibuprofen (ADVIL,MOTRIN) 200 MG tablet Take 200 mg by mouth every 6 (six) hours as needed.  Marland Kitchen lisinopril (ZESTRIL) 10 MG tablet TAKE ONE TABLET BY MOUTH ONE TIME DAILY  . metFORMIN (GLUCOPHAGE) 1000 MG tablet TAKE ONE TABLET BY MOUTH TWICE A DAY WITH A MEAL  . sitaGLIPtin (JANUVIA) 100 MG tablet Take 1 tablet (100 mg total) by mouth daily.  Marland Kitchen buPROPion (WELLBUTRIN SR) 150 MG 12 hr tablet One tablet daily for three days. Then one tablet twice daily onward. (Patient not taking: Reported on 11/03/2019)   No facility-administered medications prior to visit.    Review of Systems    Objective    BP 118/72 (BP Location: Left Arm, Patient Position: Sitting, Cuff Size: Normal)   Pulse 79   Temp (!) 96.9 F (36.1 C) (Temporal)   Wt 188 lb 6.4 oz (85.5 kg)  SpO2 97%   BMI 31.35 kg/m    Physical Exam Constitutional:      Appearance: Normal appearance.  Cardiovascular:     Rate and Rhythm: Normal rate and regular rhythm.     Pulses: Normal pulses.     Heart sounds: Normal heart sounds.  Pulmonary:     Effort: Pulmonary effort is normal.     Breath sounds: Normal breath sounds.  Feet:     Comments: Normal foot exam Skin:    General: Skin is warm and dry.  Neurological:     General: No focal deficit present.     Mental Status: She is alert and oriented to person, place, and time.  Psychiatric:        Mood and Affect: Mood normal.        Behavior: Behavior normal.       Results for orders placed or performed in visit on 05/02/20  POCT glycosylated  hemoglobin (Hb A1C)  Result Value Ref Range   Hemoglobin A1C 7.5 (A) 4.0 - 5.6 %   HbA1c POC (<> result, manual entry)     HbA1c, POC (prediabetic range)     HbA1c, POC (controlled diabetic range)     Est. average glucose Bld gHb Est-mCnc 169     Assessment & Plan    1. Type 2 diabetes mellitus with hyperosmolarity without coma, unspecified whether long term insulin use ([place]) Uncontrolled with last A1c 7.5, though down some with addition of Januvia. Work on diet.  Continue current medications UTD on vaccines, eye exam, foot exam On ACEi On Statin Discussed diet and exercise   - POCT glycosylated hemoglobin (Hb A1C)  2. Female bladder prolapse  - Ambulatory referral to Urology  3. HTN  Well controlled Continue current medications Recheck metabolic panel F/u in 3 months   4. HLD  Previously well controlled Continue statin Repeat FLP and CMP Goal LDL < 70  Return in about 3 months (around 08/02/2020) for CPE& DM,HTN,HLD.      ITrinna Post, PA-C, have reviewed all documentation for this visit. The documentation on 05/07/20 for the exam, diagnosis, procedures, and orders are all accurate and complete.    Paulene Floor  [facility] [phone] (phone) [phone] (fax)  [place]

## 2020-05-02 ENCOUNTER — Other Ambulatory Visit: Payer: Self-pay

## 2020-05-02 ENCOUNTER — Ambulatory Visit: Payer: BC Managed Care – PPO | Admitting: Physician Assistant

## 2020-05-02 ENCOUNTER — Encounter: Payer: Self-pay | Admitting: Physician Assistant

## 2020-05-02 ENCOUNTER — Other Ambulatory Visit: Payer: Self-pay | Admitting: Physician Assistant

## 2020-05-02 VITALS — BP 118/72 | HR 79 | Temp 96.9°F | Wt 188.4 lb

## 2020-05-02 DIAGNOSIS — E1100 Type 2 diabetes mellitus with hyperosmolarity without nonketotic hyperglycemic-hyperosmolar coma (NKHHC): Secondary | ICD-10-CM

## 2020-05-02 DIAGNOSIS — I152 Hypertension secondary to endocrine disorders: Secondary | ICD-10-CM

## 2020-05-02 DIAGNOSIS — E785 Hyperlipidemia, unspecified: Secondary | ICD-10-CM

## 2020-05-02 DIAGNOSIS — E1159 Type 2 diabetes mellitus with other circulatory complications: Secondary | ICD-10-CM | POA: Diagnosis not present

## 2020-05-02 DIAGNOSIS — I10 Essential (primary) hypertension: Secondary | ICD-10-CM

## 2020-05-02 DIAGNOSIS — N8110 Cystocele, unspecified: Secondary | ICD-10-CM | POA: Diagnosis not present

## 2020-05-02 LAB — POCT GLYCOSYLATED HEMOGLOBIN (HGB A1C)
Est. average glucose Bld gHb Est-mCnc: 169
Hemoglobin A1C: 7.5 % — AB (ref 4.0–5.6)

## 2020-05-02 NOTE — Patient Instructions (Signed)
Diabetes Mellitus and Exercise Exercising regularly is important for your overall health, especially when you have diabetes (diabetes mellitus). Exercising is not only about losing weight. It has many other health benefits, such as increasing muscle strength and bone density and reducing body fat and stress. This leads to improved fitness, flexibility, and endurance, all of which result in better overall health. Exercise has additional benefits for people with diabetes, including:  Reducing appetite.  Helping to lower and control blood glucose.  Lowering blood pressure.  Helping to control amounts of fatty substances (lipids) in the blood, such as cholesterol and triglycerides.  Helping the body to respond better to insulin (improving insulin sensitivity).  Reducing how much insulin the body needs.  Decreasing the risk for heart disease by: ? Lowering cholesterol and triglyceride levels. ? Increasing the levels of good cholesterol. ? Lowering blood glucose levels. What is my activity plan? Your health care provider or certified diabetes educator can help you make a plan for the type and frequency of exercise (activity plan) that works for you. Make sure that you:  Do at least 150 minutes of moderate-intensity or vigorous-intensity exercise each week. This could be brisk walking, biking, or water aerobics. ? Do stretching and strength exercises, such as yoga or weightlifting, at least 2 times a week. ? Spread out your activity over at least 3 days of the week.  Get some form of physical activity every day. ? Do not go more than 2 days in a row without some kind of physical activity. ? Avoid being inactive for more than 30 minutes at a time. Take frequent breaks to walk or stretch.  Choose a type of exercise or activity that you enjoy, and set realistic goals.  Start slowly, and gradually increase the intensity of your exercise over time. What do I need to know about managing my  diabetes?   Check your blood glucose before and after exercising. ? If your blood glucose is 240 mg/dL (13.3 mmol/L) or higher before you exercise, check your urine for ketones. If you have ketones in your urine, do not exercise until your blood glucose returns to normal. ? If your blood glucose is 100 mg/dL (5.6 mmol/L) or lower, eat a snack containing 15-20 grams of carbohydrate. Check your blood glucose 15 minutes after the snack to make sure that your level is above 100 mg/dL (5.6 mmol/L) before you start your exercise.  Know the symptoms of low blood glucose (hypoglycemia) and how to treat it. Your risk for hypoglycemia increases during and after exercise. Common symptoms of hypoglycemia can include: ? Hunger. ? Anxiety. ? Sweating and feeling clammy. ? Confusion. ? Dizziness or feeling light-headed. ? Increased heart rate or palpitations. ? Blurry vision. ? Tingling or numbness around the mouth, lips, or tongue. ? Tremors or shakes. ? Irritability.  Keep a rapid-acting carbohydrate snack available before, during, and after exercise to help prevent or treat hypoglycemia.  Avoid injecting insulin into areas of the body that are going to be exercised. For example, avoid injecting insulin into: ? The arms, when playing tennis. ? The legs, when jogging.  Keep records of your exercise habits. Doing this can help you and your health care provider adjust your diabetes management plan as needed. Write down: ? Food that you eat before and after you exercise. ? Blood glucose levels before and after you exercise. ? The type and amount of exercise you have done. ? When your insulin is expected to peak, if you use   insulin. Avoid exercising at times when your insulin is peaking.  When you start a new exercise or activity, work with your health care provider to make sure the activity is safe for you, and to adjust your insulin, medicines, or food intake as needed.  Drink plenty of water while  you exercise to prevent dehydration or heat stroke. Drink enough fluid to keep your urine clear or pale yellow. Summary  Exercising regularly is important for your overall health, especially when you have diabetes (diabetes mellitus).  Exercising has many health benefits, such as increasing muscle strength and bone density and reducing body fat and stress.  Your health care provider or certified diabetes educator can help you make a plan for the type and frequency of exercise (activity plan) that works for you.  When you start a new exercise or activity, work with your health care provider to make sure the activity is safe for you, and to adjust your insulin, medicines, or food intake as needed. This information is not intended to replace advice given to you by your health care provider. Make sure you discuss any questions you have with your health care provider. Document Revised: 04/29/2017 Document Reviewed: 03/16/2016 Elsevier Patient Education  2020 Elsevier Inc.  

## 2020-05-06 ENCOUNTER — Encounter: Payer: Self-pay | Admitting: Urology

## 2020-05-15 ENCOUNTER — Other Ambulatory Visit: Payer: Self-pay | Admitting: Physician Assistant

## 2020-05-15 DIAGNOSIS — E1100 Type 2 diabetes mellitus with hyperosmolarity without nonketotic hyperglycemic-hyperosmolar coma (NKHHC): Secondary | ICD-10-CM

## 2020-05-15 NOTE — Telephone Encounter (Signed)
Requested Prescriptions  Pending Prescriptions Disp Refills   FARXIGA 10 MG TABS tablet [Pharmacy Med Name: FARXIGA 10 MG TAB[**$]] 90 tablet 0    Sig: TAKE ONE TABLET BY MOUTH ONE TIME DAILY BEFORE BREAKFAST     Endocrinology:  Diabetes - SGLT2 Inhibitors Failed - 05/15/2020  1:02 AM      Failed - LDL in normal range and within 360 days    LDL Chol Calc (NIH)  Date Value Ref Range Status  08/03/2019 84 0 - 99 mg/dL Final   LDL Direct  Date Value Ref Range Status  07/26/2018 84 0 - 99 mg/dL Final         Passed - Cr in normal range and within 360 days    Creatinine, Ser  Date Value Ref Range Status  08/03/2019 0.84 0.57 - 1.00 mg/dL Final         Passed - HBA1C is between 0 and 7.9 and within 180 days    Hemoglobin A1C  Date Value Ref Range Status  05/02/2020 7.5 (A) 4.0 - 5.6 % Final   Hgb A1c MFr Bld  Date Value Ref Range Status  05/02/2019 8.7 (H) 4.8 - 5.6 % Final    Comment:             Prediabetes: 5.7 - 6.4          Diabetes: >6.4          Glycemic control for adults with diabetes: <7.0          Passed - eGFR in normal range and within 360 days    GFR calc Af Amer  Date Value Ref Range Status  08/03/2019 89 >59 mL/min/1.73 Final   GFR calc non Af Amer  Date Value Ref Range Status  08/03/2019 77 >59 mL/min/1.73 Final         Passed - Valid encounter within last 6 months    Recent Outpatient Visits          1 week ago Type 2 diabetes mellitus with hyperosmolarity without coma, unspecified whether long term insulin use [facility])   [place], Adriana M, PA-C   3 months ago Type 2 diabetes mellitus with hyperosmolarity without coma, unspecified whether long term insulin use [facility])   [place], Adriana M, PA-C   6 months ago Type 2 diabetes mellitus with hyperosmolarity without coma, unspecified whether long term insulin use [facility])   [employer], [place], [place]   9 months ago Annual physical  exam   [facility] Carles Collet M, PA-C   1 year ago Type 2 diabetes mellitus with hyperosmolarity without coma, unspecified whether long term insulin use [facility])   [place], Wendee Beavers, [place]      Future Appointments            In 5 days MacDiarmid, Nicki Reaper, MD [place]   In 2 months Trinna Post, PA-C [employer], [facility]

## 2020-05-15 NOTE — Telephone Encounter (Signed)
Requested Prescriptions  Pending Prescriptions Disp Refills  . glipiZIDE (GLUCOTROL XL) 10 MG 24 hr tablet [Pharmacy Med Name: GLIPIZIDE ER 10 MG TAB[*]] 180 tablet 1    Sig: TAKE ONE TABLET BY MOUTH ONE TIME DAILY WITH BREAKFAST FOR 30 DAYS THEN INCREASE TO TAKE TWO TABLETS BY MOUTH ONE TIME DAILY WITH BREAKFAST     Endocrinology:  Diabetes - Sulfonylureas Passed - 05/15/2020 12:52 AM      Passed - HBA1C is between 0 and 7.9 and within 180 days    Hemoglobin A1C  Date Value Ref Range Status  05/02/2020 7.5 (A) 4.0 - 5.6 % Final   Hgb A1c MFr Bld  Date Value Ref Range Status  05/02/2019 8.7 (H) 4.8 - 5.6 % Final    Comment:             Prediabetes: 5.7 - 6.4          Diabetes: >6.4          Glycemic control for adults with diabetes: <7.0          Passed - Valid encounter within last 6 months    Recent Outpatient Visits          1 week ago Type 2 diabetes mellitus with hyperosmolarity without coma, unspecified whether long term insulin use [facility])   [place], Adriana M, PA-C   3 months ago Type 2 diabetes mellitus with hyperosmolarity without coma, unspecified whether long term insulin use [facility])   [place], Adriana M, PA-C   6 months ago Type 2 diabetes mellitus with hyperosmolarity without coma, unspecified whether long term insulin use [facility])   [facility] [place], Fabio Bering M, [place]   9 months ago Annual physical exam   [facility] [place], Fabio Bering M, PA-C   1 year ago Type 2 diabetes mellitus with hyperosmolarity without coma, unspecified whether long term insulin use [facility])   [place], Wendee Beavers, [place]      Future Appointments            In 5 days MacDiarmid, Nicki Reaper, MD [place]   In 2 months Trinna Post, PA-C [employer], [place]

## 2020-05-20 ENCOUNTER — Other Ambulatory Visit: Payer: Self-pay

## 2020-05-20 ENCOUNTER — Encounter: Payer: Self-pay | Admitting: Urology

## 2020-05-20 ENCOUNTER — Ambulatory Visit (INDEPENDENT_AMBULATORY_CARE_PROVIDER_SITE_OTHER): Payer: BC Managed Care – PPO | Admitting: Urology

## 2020-05-20 VITALS — BP 142/81 | HR 90 | Ht 65.0 in | Wt 192.0 lb

## 2020-05-20 DIAGNOSIS — N8110 Cystocele, unspecified: Secondary | ICD-10-CM

## 2020-05-20 DIAGNOSIS — N3946 Mixed incontinence: Secondary | ICD-10-CM | POA: Diagnosis not present

## 2020-05-20 LAB — URINALYSIS, COMPLETE
Bilirubin, UA: NEGATIVE
Ketones, UA: NEGATIVE
Leukocytes,UA: NEGATIVE
Nitrite, UA: NEGATIVE
Protein,UA: NEGATIVE
Specific Gravity, UA: 1.02 (ref 1.005–1.030)
Urobilinogen, Ur: 0.2 mg/dL (ref 0.2–1.0)
pH, UA: 5.5 (ref 5.0–7.5)

## 2020-05-20 LAB — MICROSCOPIC EXAMINATION: Bacteria, UA: NONE SEEN

## 2020-05-20 NOTE — Progress Notes (Signed)
05/20/2020 8:44 AM   [name] 1961/01/28 [id number]  Referring provider: Trinna Post, PA-C [address] [place],  [place] [address]  Chief Complaint  Patient presents with  . Bladder Prolapse    HPI: I was consulted to assess the patient's prolapse worsening over number years.  She feels vaginal bulging.  Sometimes she reduces it.  She can leak with coughing sneezing but not bending lifting.  She has urge incontinence if she holds it too long.  No bedwetting.  Does not wear a pad  Voids every 2-3 hours and gets up 3 times a night with a good flow  She has had a hysterectomy.  She is a nurse  She denies a history of kidney stones previous bladder surgery and bladder infections.  Bowel movements normal   PMH: Past Medical History:  Diagnosis Date  . Allergy   . Arthritis   . Diabetes mellitus without complication ([place])   . Hyperlipidemia   . Hypertension     Surgical History: Past Surgical History:  Procedure Laterality Date  . ABDOMINAL HYSTERECTOMY    . CESAREAN SECTION      Home Medications:  Allergies as of 05/20/2020      Reactions   Other Other (See Comments)   Per patient   Neosporin [neomycin-bacitracin Zn-polymyx]    Tape       Medication List       Accurate as of May 20, 2020  8:44 AM. If you have any questions, ask your nurse or doctor.        STOP taking these medications   buPROPion 150 MG 12 hr tablet Commonly known as: Wellbutrin SR Stopped by: Reece Packer, MD     TAKE these medications   amLODipine 10 MG tablet Commonly known as: NORVASC TAKE ONE TABLET BY MOUTH ONE TIME DAILY   atorvastatin 20 MG tablet Commonly known as: LIPITOR TAKE ONE TABLET BY MOUTH ONE TIME DAILY   Farxiga 10 MG Tabs tablet Generic drug: dapagliflozin propanediol TAKE ONE TABLET BY MOUTH ONE TIME DAILY BEFORE BREAKFAST   gabapentin 300 MG capsule Commonly known as: NEURONTIN TAKE ONE CAPSULE BY MOUTH THREE TIMES A  DAY   glipiZIDE 10 MG 24 hr tablet Commonly known as: GLUCOTROL XL TAKE ONE TABLET BY MOUTH ONE TIME DAILY WITH BREAKFAST FOR 30 DAYS THEN INCREASE TO TAKE TWO TABLETS BY MOUTH ONE TIME DAILY WITH BREAKFAST   ibuprofen 200 MG tablet Commonly known as: ADVIL Take 200 mg by mouth every 6 (six) hours as needed.   lisinopril 10 MG tablet Commonly known as: ZESTRIL TAKE ONE TABLET BY MOUTH ONE TIME DAILY   metFORMIN 1000 MG tablet Commonly known as: GLUCOPHAGE TAKE ONE TABLET BY MOUTH TWICE A DAY WITH A MEAL   sitaGLIPtin 100 MG tablet Commonly known as: Januvia Take 1 tablet (100 mg total) by mouth daily.       Allergies:  Allergies  Allergen Reactions  . Other Other (See Comments)    Per patient  . Neosporin [Neomycin-Bacitracin Zn-Polymyx]   . Tape     Family History: Family History  Problem Relation Age of Onset  . Hypothyroidism Mother   . Hyperlipidemia Mother   . Diabetes Mother   . Heart attack Mother   . Hyperlipidemia Father   . Diabetes Father   . Heart attack Father   . Diabetes Sister   . Kidney disease Sister   . Colon cancer Neg Hx   .  Esophageal cancer Neg Hx   . Rectal cancer Neg Hx   . Stomach cancer Neg Hx     Social History:  reports that she has been smoking cigarettes. She has a 20.00 pack-year smoking history. She has never used smokeless tobacco. She reports that she does not drink alcohol and does not use drugs.  ROS:                                        Physical Exam: BP (!) 142/81   Pulse 90   Ht 5\' 5"  (1.651 m)   Wt 87.1 kg   BMI 31.95 kg/m   Constitutional:  Alert and oriented, No acute distress. HEENT: [place] AT, moist mucus membranes.  Trachea midline, no masses. Cardiovascular: No clubbing, cyanosis, or edema. Respiratory: Normal respiratory effort, no increased work of breathing. GI: Abdomen is soft, nontender, nondistended, no abdominal masses GU: No CVA tenderness. On pelvic examination patient  had near complete vault prolapse. The speculum was smaller and difficult to examine the patient. At full descensus she lost nearly all of her anterior and posterior length. She may have an enterocele. It was difficult to reduce the prolapse effectively with the speculum. It appeared when I reduce the apex and posterior wall she had a grade 2 cystocele. When I reduced anteriorly she had a smaller rectocele. The vaginal volume was quite capacious making the exam more challenging. Skin: No rashes, bruises or suspicious lesions. Lymph: No cervical or inguinal adenopathy. Neurologic: Grossly intact, no focal deficits, moving all 4 extremities. Psychiatric: Normal mood and affect.  Laboratory Data: Lab Results  Component Value Date   WBC 10.0 08/03/2019   HGB 15.5 08/03/2019   HCT 47.2 (H) 08/03/2019   MCV 88 08/03/2019   PLT 254 08/03/2019    Lab Results  Component Value Date   CREATININE 0.84 08/03/2019    No results found for: PSA  No results found for: TESTOSTERONE  Lab Results  Component Value Date   HGBA1C 7.5 (A) 05/02/2020    Urinalysis No results found for: COLORURINE, APPEARANCEUR, LABSPEC, [place], GLUCOSEU, HGBUR, BILIRUBINUR, KETONESUR, PROTEINUR, UROBILINOGEN, NITRITE, LEUKOCYTESUR  Pertinent Imaging:   Assessment & Plan: Patient has prolapse symptoms of mild mixed incontinence role of urodynamics discussed.  She has mild frequency and nocturia. A picture was drawn. Patient understands if she had surgery she does benefit from a robotic sacrocolpopexy. I would like to reexamine her with a larger speculum to make certain that with the procedure she would not need a rectocele repair. I do not think she would need this.  Patient is not sexually active but does not want a pessary.  She actually chose watchful waiting because she is not very bothered by it.  She will call for urodynamics if she ever changes her mind and wishes to proceed with surgery  1. Female bladder  prolapse  - Urinalysis, Complete; Future - Urinalysis, Complete   No follow-ups on file.  Reece Packer, MD  [facility] [address] [place], [place] [address] [phone]

## 2020-07-31 ENCOUNTER — Other Ambulatory Visit: Payer: Self-pay | Admitting: Physician Assistant

## 2020-07-31 DIAGNOSIS — E1100 Type 2 diabetes mellitus with hyperosmolarity without nonketotic hyperglycemic-hyperosmolar coma (NKHHC): Secondary | ICD-10-CM

## 2020-07-31 DIAGNOSIS — E1159 Type 2 diabetes mellitus with other circulatory complications: Secondary | ICD-10-CM

## 2020-07-31 DIAGNOSIS — I152 Hypertension secondary to endocrine disorders: Secondary | ICD-10-CM

## 2020-07-31 NOTE — Telephone Encounter (Signed)
Requested medications are due for refill today?  Yes  Requested medications are on active medication list?  Yes  Last Refill:  05/02/2020  # 90 with no refills    Future visit scheduled?  Yes in 2 days.    Notes to Clinic:  Medication failed Rx refill protocol due to no labs within the past 180 days.   Last labs were performed on 08/03/2019.

## 2020-07-31 NOTE — Telephone Encounter (Signed)
Requested Prescriptions  Pending Prescriptions Disp Refills   amLODipine (NORVASC) 10 MG tablet [Pharmacy Med Name: AMLODIPINE 10 MG TAB[*]] 90 tablet 0    Sig: TAKE ONE TABLET BY MOUTH ONE TIME DAILY     Cardiovascular:  Calcium Channel Blockers Failed - 07/31/2020  2:05 AM      Failed - Last BP in normal range    BP Readings from Last 1 Encounters:  05/20/20 (!) 142/81         Passed - Valid encounter within last 6 months    Recent Outpatient Visits          3 months ago Type 2 diabetes mellitus with hyperosmolarity without coma, unspecified whether long term insulin use ([place])   [place], [place], PA-C   6 months ago Type 2 diabetes mellitus with hyperosmolarity without coma, unspecified whether long term insulin use ([place])   [place], Lucan, PA-C   9 months ago Type 2 diabetes mellitus with hyperosmolarity without coma, unspecified whether long term insulin use [facility])   [place], [place], [place]   12 months ago Annual physical exam   [facility] [place], Fabio Bering M, PA-C   1 year ago Type 2 diabetes mellitus with hyperosmolarity without coma, unspecified whether long term insulin use [facility])   [facility] [place], Wendee Beavers, [place]      Future Appointments            In 2 days Trinna Post, PA-C [place], [facility]            metFORMIN (GLUCOPHAGE) 1000 MG tablet [employer] Med Name: METFORMIN 1000 MG TAB] 180 tablet 0    Sig: TAKE ONE TABLET BY MOUTH TWICE A DAY WITH A MEAL     Endocrinology:  Diabetes - Biguanides Failed - 07/31/2020  2:05 AM      Failed - Cr in normal range and within 360 days    Creatinine, Ser  Date Value Ref Range Status  08/03/2019 0.84 0.57 - 1.00 mg/dL Final         Failed - eGFR in normal range and within 360 days    GFR calc Af Amer  Date Value Ref Range Status  08/03/2019 89 >59 mL/min/1.73 Final   GFR calc non Af  Amer  Date Value Ref Range Status  08/03/2019 77 >59 mL/min/1.73 Final         Passed - HBA1C is between 0 and 7.9 and within 180 days    Hemoglobin A1C  Date Value Ref Range Status  05/02/2020 7.5 (A) 4.0 - 5.6 % Final   Hgb A1c MFr Bld  Date Value Ref Range Status  05/02/2019 8.7 (H) 4.8 - 5.6 % Final    Comment:             Prediabetes: 5.7 - 6.4          Diabetes: >6.4          Glycemic control for adults with diabetes: <7.0          Passed - Valid encounter within last 6 months    Recent Outpatient Visits          3 months ago Type 2 diabetes mellitus with hyperosmolarity without coma, unspecified whether long term insulin use [facility])   [place], Fabio Bering M, PA-C   6 months ago Type 2 diabetes mellitus with hyperosmolarity without coma, unspecified whether long term insulin use (  [facility])   [place], Ivalee, PA-C   9 months ago Type 2 diabetes mellitus with hyperosmolarity without coma, unspecified whether long term insulin use [facility])   [facility] Carles Collet M, [place]   12 months ago Annual physical exam   [facility] Carles Collet M, PA-C   1 year ago Type 2 diabetes mellitus with hyperosmolarity without coma, unspecified whether long term insulin use [facility])   [facility] [place], Wendee Beavers, [place]      Future Appointments            In 2 days Trinna Post, PA-C [place], [facility]            lisinopril (ZESTRIL) 10 MG tablet [employer] Med Name: LISINOPRIL 10 MG TAB] 90 tablet 0    Sig: TAKE ONE TABLET BY MOUTH ONE TIME DAILY     Cardiovascular:  ACE Inhibitors Failed - 07/31/2020  2:05 AM      Failed - Cr in normal range and within 180 days    Creatinine, Ser  Date Value Ref Range Status  08/03/2019 0.84 0.57 - 1.00 mg/dL Final         Failed - K in normal range and within 180 days    Potassium  Date Value Ref Range Status  08/03/2019 4.4 3.5 -  5.2 mmol/L Final         Failed - Last BP in normal range    BP Readings from Last 1 Encounters:  05/20/20 (!) 142/81         Passed - Patient is not pregnant      Passed - Valid encounter within last 6 months    Recent Outpatient Visits          3 months ago Type 2 diabetes mellitus with hyperosmolarity without coma, unspecified whether long term insulin use [facility])   [place], Adriana M, PA-C   6 months ago Type 2 diabetes mellitus with hyperosmolarity without coma, unspecified whether long term insulin use [facility])   [place], [place], PA-C   9 months ago Type 2 diabetes mellitus with hyperosmolarity without coma, unspecified whether long term insulin use [facility])   [place], [place], [place]   12 months ago Annual physical exam   [facility] [place], Fabio Bering M, PA-C   1 year ago Type 2 diabetes mellitus with hyperosmolarity without coma, unspecified whether long term insulin use [facility])   [place], Wendee Beavers, [place]      Future Appointments            In 2 days Trinna Post, PA-C [employer], [facility]

## 2020-08-02 ENCOUNTER — Encounter: Payer: Self-pay | Admitting: Physician Assistant

## 2020-08-02 ENCOUNTER — Ambulatory Visit: Payer: BC Managed Care – PPO | Admitting: Physician Assistant

## 2020-08-02 ENCOUNTER — Other Ambulatory Visit: Payer: Self-pay

## 2020-08-02 VITALS — BP 120/82 | HR 72 | Ht 65.0 in | Wt 190.6 lb

## 2020-08-02 DIAGNOSIS — E1100 Type 2 diabetes mellitus with hyperosmolarity without nonketotic hyperglycemic-hyperosmolar coma (NKHHC): Secondary | ICD-10-CM | POA: Diagnosis not present

## 2020-08-02 DIAGNOSIS — E1159 Type 2 diabetes mellitus with other circulatory complications: Secondary | ICD-10-CM | POA: Diagnosis not present

## 2020-08-02 DIAGNOSIS — Z0000 Encounter for general adult medical examination without abnormal findings: Secondary | ICD-10-CM

## 2020-08-02 DIAGNOSIS — I152 Hypertension secondary to endocrine disorders: Secondary | ICD-10-CM

## 2020-08-02 DIAGNOSIS — Z1231 Encounter for screening mammogram for malignant neoplasm of breast: Secondary | ICD-10-CM | POA: Diagnosis not present

## 2020-08-02 DIAGNOSIS — E785 Hyperlipidemia, unspecified: Secondary | ICD-10-CM | POA: Diagnosis not present

## 2020-08-02 NOTE — Progress Notes (Signed)
Established patient visit   Patient: Stacie Fleming   DOB: 1961/09/04   59 y.o. Female  MRN: [id number] Visit Date: 08/02/2020  Today's healthcare provider: Trinna Post, PA-C   Chief Complaint  Patient presents with  . Annual Exam  . Diabetes  . Hyperlipidemia  . Hypertension   Subjective    HPI   Hypertension, follow-up  BP Readings from Last 3 Encounters:  08/02/20 120/82  05/20/20 (!) 142/81  05/02/20 118/72   Wt Readings from Last 3 Encounters:  08/02/20 190 lb 9.6 oz (86.5 kg)  05/20/20 192 lb (87.1 kg)  05/02/20 188 lb 6.4 oz (85.5 kg)     She was last seen for hypertension 3 months ago.  BP at that visit was normal. Management since that visit includes continue medications.  She reports excellent compliance with treatment. She is not having side effects.  She is following a Regular diet. She is not exercising. She does not smoke.  Use of agents associated with hypertension: none.   Outside blood pressures are not checked. Symptoms: No chest pain No chest pressure  No palpitations No syncope  No dyspnea No orthopnea  No paroxysmal nocturnal dyspnea No lower extremity edema   Pertinent labs: Lab Results  Component Value Date   CHOL 172 08/03/2019   HDL 42 08/03/2019   LDLCALC 84 08/03/2019   LDLDIRECT 84 07/26/2018   TRIG 281 (H) 08/03/2019   CHOLHDL 5.6 (H) 07/26/2018   Lab Results  Component Value Date   NA 138 08/03/2019   K 4.4 08/03/2019   CREATININE 0.84 08/03/2019   GFRNONAA 77 08/03/2019   GFRAA 89 08/03/2019   GLUCOSE 181 (H) 08/03/2019     The 10-year ASCVD risk score Mikey Bussing DC Jr., et al., 2013) is: 15.1%   ---------------------------------------------------------------------------------------------------  Lipid/Cholesterol, Follow-up  Last lipid panel Other pertinent labs  Lab Results  Component Value Date   CHOL 172 08/03/2019   HDL 42 08/03/2019   LDLCALC 84 08/03/2019   LDLDIRECT 84 07/26/2018   TRIG 281  (H) 08/03/2019   CHOLHDL 5.6 (H) 07/26/2018   Lab Results  Component Value Date   ALT 22 08/03/2019   AST 15 08/03/2019   PLT 254 08/03/2019   TSH 2.190 08/03/2019     She was last seen for this 4 months ago.  Management since that visit includes continues statin.  She reports excellent compliance with treatment. She is not having side effects.   Symptoms: No chest pain No chest pressure/discomfort  No dyspnea No lower extremity edema  No numbness or tingling of extremity No orthopnea  No palpitations No paroxysmal nocturnal dyspnea  No speech difficulty No syncope   Current diet: not asked Current exercise: none  The 10-year ASCVD risk score Mikey Bussing DC Jr., et al., 2013) is: 15.1%  ---------------------------------------------------------------------------------------------------  Diabetes Mellitus Type II, Follow-up  Lab Results  Component Value Date   HGBA1C 7.5 (A) 05/02/2020   HGBA1C 8.2 (A) 02/01/2020   HGBA1C 8.5 (A) 11/03/2019   Wt Readings from Last 3 Encounters:  08/02/20 190 lb 9.6 oz (86.5 kg)  05/20/20 192 lb (87.1 kg)  05/02/20 188 lb 6.4 oz (85.5 kg)   Last seen for diabetes 4 months ago.  Management since then includes continue farxiga, glipizide, metformin, and januvia.  She reports excellent compliance with treatment. She is not having side effects.  Symptoms: No fatigue No foot ulcerations  No appetite changes No nausea  No paresthesia of the feet  No polydipsia  No polyuria No visual disturbances   No vomiting     Home blood sugar records: fasting range: not checked  Episodes of hypoglycemia? No    Current insulin regiment: none Most Recent Eye Exam: 09/2019 Current exercise: none Current diet habits: not asked  Pertinent Labs: Lab Results  Component Value Date   CHOL 172 08/03/2019   HDL 42 08/03/2019   LDLCALC 84 08/03/2019   LDLDIRECT 84 07/26/2018   TRIG 281 (H) 08/03/2019   CHOLHDL 5.6 (H) 07/26/2018   Lab Results    Component Value Date   NA 138 08/03/2019   K 4.4 08/03/2019   CREATININE 0.84 08/03/2019   GFRNONAA 77 08/03/2019   GFRAA 89 08/03/2019   GLUCOSE 181 (H) 08/03/2019     ---------------------------------------------------------------------------------------------------      Medications: Outpatient Medications Prior to Visit  Medication Sig  . amLODipine (NORVASC) 10 MG tablet TAKE ONE TABLET BY MOUTH ONE TIME DAILY  . atorvastatin (LIPITOR) 20 MG tablet TAKE ONE TABLET BY MOUTH ONE TIME DAILY  . FARXIGA 10 MG TABS tablet TAKE ONE TABLET BY MOUTH ONE TIME DAILY BEFORE BREAKFAST  . gabapentin (NEURONTIN) 300 MG capsule TAKE ONE CAPSULE BY MOUTH THREE TIMES A DAY  . glipiZIDE (GLUCOTROL XL) 10 MG 24 hr tablet TAKE ONE TABLET BY MOUTH ONE TIME DAILY WITH BREAKFAST FOR 30 DAYS THEN INCREASE TO TAKE TWO TABLETS BY MOUTH ONE TIME DAILY WITH BREAKFAST  . ibuprofen (ADVIL,MOTRIN) 200 MG tablet Take 200 mg by mouth every 6 (six) hours as needed.  Marland Kitchen lisinopril (ZESTRIL) 10 MG tablet TAKE ONE TABLET BY MOUTH ONE TIME DAILY  . metFORMIN (GLUCOPHAGE) 1000 MG tablet TAKE ONE TABLET BY MOUTH TWICE A DAY WITH A MEAL  . sitaGLIPtin (JANUVIA) 100 MG tablet Take 1 tablet (100 mg total) by mouth daily.   No facility-administered medications prior to visit.    Review of Systems    Objective    BP 120/82   Pulse 72   Ht 5\' 5"  (1.651 m)   Wt 190 lb 9.6 oz (86.5 kg)   SpO2 98%   BMI 31.72 kg/m    Physical Exam Constitutional:      Appearance: Normal appearance.  HENT:     Right Ear: Tympanic membrane and ear canal normal.     Left Ear: Tympanic membrane and ear canal normal.  Cardiovascular:     Rate and Rhythm: Normal rate and regular rhythm.     Heart sounds: Normal heart sounds.  Pulmonary:     Effort: Pulmonary effort is normal.     Breath sounds: Normal breath sounds.  Abdominal:     General: Bowel sounds are normal.     Palpations: Abdomen is soft.  Musculoskeletal:      Cervical back: Normal range of motion and neck supple.  Lymphadenopathy:     Cervical: No cervical adenopathy.  Skin:    General: Skin is warm and dry.  Neurological:     Mental Status: She is alert and oriented to person, place, and time. Mental status is at baseline.  Psychiatric:        Mood and Affect: Mood normal.        Behavior: Behavior normal.       No results found for any visits on 08/02/20.  Assessment & Plan    1. Annual physical exam  - TSH - Lipid panel - Comprehensive metabolic panel - CBC with Differential/Platelet  2. Type 2 diabetes mellitus with  hyperosmolarity without coma, unspecified whether long term insulin use (HCC)  - HgB A1c - Ambulatory referral to Ophthalmology  3. Hyperlipidemia, unspecified hyperlipidemia type  Previously well controlled Continue statin Repeat FLP and CMP Goal LDL < 70  4. Hypertension associated with diabetes ([place])  Well controlled Continue current medications Recheck metabolic panel F/u in 3 months  5. Encounter for screening mammogram for malignant neoplasm of breast  - MM Digital Screening; Future   No follow-ups on file.      ITrinna Post, PA-C, have reviewed all documentation for this visit. The documentation on 08/02/20 for the exam, diagnosis, procedures, and orders are all accurate and complete.  The entirety of the information documented in the History of Present Illness, Review of Systems and Physical Exam were personally obtained by me. Portions of this information were initially documented by Velva Harman, CMA and reviewed by me for thoroughness and accuracy.        Paulene Floor  [facility] [phone] (phone) [phone] (fax)  [place]

## 2020-08-02 NOTE — Patient Instructions (Signed)
Health Maintenance, Female Adopting a healthy lifestyle and getting preventive care are important in promoting health and wellness. Ask your health care provider about:  The right schedule for you to have regular tests and exams.  Things you can do on your own to prevent diseases and keep yourself healthy. What should I know about diet, weight, and exercise? Eat a healthy diet   Eat a diet that includes plenty of vegetables, fruits, low-fat dairy products, and lean protein.  Do not eat a lot of foods that are high in solid fats, added sugars, or sodium. Maintain a healthy weight Body mass index (BMI) is used to identify weight problems. It estimates body fat based on height and weight. Your health care provider can help determine your BMI and help you achieve or maintain a healthy weight. Get regular exercise Get regular exercise. This is one of the most important things you can do for your health. Most adults should:  Exercise for at least 150 minutes each week. The exercise should increase your heart rate and make you sweat (moderate-intensity exercise).  Do strengthening exercises at least twice a week. This is in addition to the moderate-intensity exercise.  Spend less time sitting. Even light physical activity can be beneficial. Watch cholesterol and blood lipids Have your blood tested for lipids and cholesterol at 59 years of age, then have this test every 5 years. Have your cholesterol levels checked more often if:  Your lipid or cholesterol levels are high.  You are older than 59 years of age.  You are at high risk for heart disease. What should I know about cancer screening? Depending on your health history and family history, you may need to have cancer screening at various ages. This may include screening for:  Breast cancer.  Cervical cancer.  Colorectal cancer.  Skin cancer.  Lung cancer. What should I know about heart disease, diabetes, and high blood  pressure? Blood pressure and heart disease  High blood pressure causes heart disease and increases the risk of stroke. This is more likely to develop in people who have high blood pressure readings, are of African descent, or are overweight.  Have your blood pressure checked: ? Every 3-5 years if you are 18-39 years of age. ? Every year if you are 40 years old or older. Diabetes Have regular diabetes screenings. This checks your fasting blood sugar level. Have the screening done:  Once every three years after age 40 if you are at a normal weight and have a low risk for diabetes.  More often and at a younger age if you are overweight or have a high risk for diabetes. What should I know about preventing infection? Hepatitis B If you have a higher risk for hepatitis B, you should be screened for this virus. Talk with your health care provider to find out if you are at risk for hepatitis B infection. Hepatitis C Testing is recommended for:  Everyone born from 1945 through 1965.  Anyone with known risk factors for hepatitis C. Sexually transmitted infections (STIs)  Get screened for STIs, including gonorrhea and chlamydia, if: ? You are sexually active and are younger than 59 years of age. ? You are older than 59 years of age and your health care provider tells you that you are at risk for this type of infection. ? Your sexual activity has changed since you were last screened, and you are at increased risk for chlamydia or gonorrhea. Ask your health care provider if   you are at risk.  Ask your health care provider about whether you are at high risk for HIV. Your health care provider may recommend a prescription medicine to help prevent HIV infection. If you choose to take medicine to prevent HIV, you should first get tested for HIV. You should then be tested every 3 months for as long as you are taking the medicine. Pregnancy  If you are about to stop having your period (premenopausal) and  you may become pregnant, seek counseling before you get pregnant.  Take 400 to 800 micrograms (mcg) of folic acid every day if you become pregnant.  Ask for birth control (contraception) if you want to prevent pregnancy. Osteoporosis and menopause Osteoporosis is a disease in which the bones lose minerals and strength with aging. This can result in bone fractures. If you are 65 years old or older, or if you are at risk for osteoporosis and fractures, ask your health care provider if you should:  Be screened for bone loss.  Take a calcium or vitamin D supplement to lower your risk of fractures.  Be given hormone replacement therapy (HRT) to treat symptoms of menopause. Follow these instructions at home: Lifestyle  Do not use any products that contain nicotine or tobacco, such as cigarettes, e-cigarettes, and chewing tobacco. If you need help quitting, ask your health care provider.  Do not use street drugs.  Do not share needles.  Ask your health care provider for help if you need support or information about quitting drugs. Alcohol use  Do not drink alcohol if: ? Your health care provider tells you not to drink. ? You are pregnant, may be pregnant, or are planning to become pregnant.  If you drink alcohol: ? Limit how much you use to 0-1 drink a day. ? Limit intake if you are breastfeeding.  Be aware of how much alcohol is in your drink. In the [place] one drink equals one 12 oz bottle of beer (355 mL), one 5 oz glass of wine (148 mL), or one 1 oz glass of hard liquor (44 mL). General instructions  Schedule regular health, dental, and eye exams.  Stay current with your vaccines.  Tell your health care provider if: ? You often feel depressed. ? You have ever been abused or do not feel safe at home. Summary  Adopting a healthy lifestyle and getting preventive care are important in promoting health and wellness.  Follow your health care provider's instructions about healthy  diet, exercising, and getting tested or screened for diseases.  Follow your health care provider's instructions on monitoring your cholesterol and blood pressure. This information is not intended to replace advice given to you by your health care provider. Make sure you discuss any questions you have with your health care provider. Document Revised: 09/28/2018 Document Reviewed: 09/28/2018 Elsevier Patient Education  2020 Elsevier Inc.  

## 2020-08-03 LAB — CBC WITH DIFFERENTIAL/PLATELET
Basophils Absolute: 0.1 10*3/uL (ref 0.0–0.2)
Basos: 1 %
EOS (ABSOLUTE): 0.4 10*3/uL (ref 0.0–0.4)
Eos: 3 %
Hematocrit: 46.5 % (ref 34.0–46.6)
Hemoglobin: 15.4 g/dL (ref 11.1–15.9)
Immature Grans (Abs): 0.1 10*3/uL (ref 0.0–0.1)
Immature Granulocytes: 1 %
Lymphocytes Absolute: 4.5 10*3/uL — ABNORMAL HIGH (ref 0.7–3.1)
Lymphs: 36 %
MCH: 28.3 pg (ref 26.6–33.0)
MCHC: 33.1 g/dL (ref 31.5–35.7)
MCV: 86 fL (ref 79–97)
Monocytes Absolute: 0.8 10*3/uL (ref 0.1–0.9)
Monocytes: 7 %
Neutrophils Absolute: 6.7 10*3/uL (ref 1.4–7.0)
Neutrophils: 52 %
Platelets: 272 10*3/uL (ref 150–450)
RBC: 5.44 x10E6/uL — ABNORMAL HIGH (ref 3.77–5.28)
RDW: 14 % (ref 11.7–15.4)
WBC: 12.6 10*3/uL — ABNORMAL HIGH (ref 3.4–10.8)

## 2020-08-03 LAB — COMPREHENSIVE METABOLIC PANEL
ALT: 22 IU/L (ref 0–32)
AST: 16 IU/L (ref 0–40)
Albumin/Globulin Ratio: 1.7 (ref 1.2–2.2)
Albumin: 4.8 g/dL (ref 3.8–4.9)
Alkaline Phosphatase: 83 IU/L (ref 44–121)
BUN/Creatinine Ratio: 19 (ref 9–23)
BUN: 16 mg/dL (ref 6–24)
Bilirubin Total: 0.3 mg/dL (ref 0.0–1.2)
CO2: 19 mmol/L — ABNORMAL LOW (ref 20–29)
Calcium: 10.1 mg/dL (ref 8.7–10.2)
Chloride: 102 mmol/L (ref 96–106)
Creatinine, Ser: 0.84 mg/dL (ref 0.57–1.00)
GFR calc Af Amer: 88 mL/min/{1.73_m2} (ref >59)
GFR calc non Af Amer: 76 mL/min/{1.73_m2} (ref >59)
Globulin, Total: 2.8 g/dL (ref 1.5–4.5)
Glucose: 85 mg/dL (ref 65–99)
Potassium: 4.5 mmol/L (ref 3.5–5.2)
Sodium: 139 mmol/L (ref 134–144)
Total Protein: 7.6 g/dL (ref 6.0–8.5)

## 2020-08-03 LAB — LIPID PANEL
Chol/HDL Ratio: 4 ratio (ref 0.0–4.4)
Cholesterol, Total: 165 mg/dL (ref 100–199)
HDL: 41 mg/dL (ref >39)
LDL Chol Calc (NIH): 73 mg/dL (ref 0–99)
Triglycerides: 315 mg/dL — ABNORMAL HIGH (ref 0–149)
VLDL Cholesterol Cal: 51 mg/dL — ABNORMAL HIGH (ref 5–40)

## 2020-08-03 LAB — HEMOGLOBIN A1C
Est. average glucose Bld gHb Est-mCnc: 197 mg/dL
Hgb A1c MFr Bld: 8.5 % — ABNORMAL HIGH (ref 4.8–5.6)

## 2020-08-03 LAB — TSH: TSH: 1.91 u[IU]/mL (ref 0.450–4.500)

## 2020-08-07 ENCOUNTER — Other Ambulatory Visit: Payer: Self-pay | Admitting: Physician Assistant

## 2020-08-07 DIAGNOSIS — E1100 Type 2 diabetes mellitus with hyperosmolarity without nonketotic hyperglycemic-hyperosmolar coma (NKHHC): Secondary | ICD-10-CM

## 2020-08-07 NOTE — Telephone Encounter (Signed)
Requested Prescriptions  Pending Prescriptions Disp Refills  . JANUVIA 100 MG tablet [Pharmacy Med Name: JANUVIA 100 MG TAB[*$]] 90 tablet 1    Sig: TAKE ONE TABLET BY MOUTH ONE TIME DAILY     Endocrinology:  Diabetes - DPP-4 Inhibitors Failed - 08/07/2020  3:12 PM      Failed - HBA1C is between 0 and 7.9 and within 180 days    Hgb A1c MFr Bld  Date Value Ref Range Status  08/02/2020 8.5 (H) 4.8 - 5.6 % Final    Comment:             Prediabetes: 5.7 - 6.4          Diabetes: >6.4          Glycemic control for adults with diabetes: <7.0          Passed - Cr in normal range and within 360 days    Creatinine, Ser  Date Value Ref Range Status  08/02/2020 0.84 0.57 - 1.00 mg/dL Final         Passed - Valid encounter within last 6 months    Recent Outpatient Visits          5 days ago Annual physical exam   [facility] Terrilee Croak, Adriana M, PA-C   3 months ago Type 2 diabetes mellitus with hyperosmolarity without coma, unspecified whether long term insulin use [facility])   [place], [place], PA-C   6 months ago Type 2 diabetes mellitus with hyperosmolarity without coma, unspecified whether long term insulin use [facility])   [place], [place], PA-C   9 months ago Type 2 diabetes mellitus with hyperosmolarity without coma, unspecified whether long term insulin use [facility])   [facility] [place], Wendee Beavers, [place]   1 year ago Annual physical exam   [facility] Trinna Post, [place]      Future Appointments            In 3 months Terrilee Croak, Wendee Beavers, [place], [place]

## 2020-08-20 ENCOUNTER — Other Ambulatory Visit: Payer: Self-pay | Admitting: Physician Assistant

## 2020-08-20 DIAGNOSIS — E1100 Type 2 diabetes mellitus with hyperosmolarity without nonketotic hyperglycemic-hyperosmolar coma (NKHHC): Secondary | ICD-10-CM

## 2020-09-30 DIAGNOSIS — E119 Type 2 diabetes mellitus without complications: Secondary | ICD-10-CM | POA: Diagnosis not present

## 2020-09-30 LAB — HM DIABETES EYE EXAM

## 2020-10-29 ENCOUNTER — Other Ambulatory Visit: Payer: Self-pay | Admitting: Physician Assistant

## 2020-10-29 DIAGNOSIS — I152 Hypertension secondary to endocrine disorders: Secondary | ICD-10-CM

## 2020-10-29 DIAGNOSIS — E1159 Type 2 diabetes mellitus with other circulatory complications: Secondary | ICD-10-CM

## 2020-10-29 DIAGNOSIS — E1100 Type 2 diabetes mellitus with hyperosmolarity without nonketotic hyperglycemic-hyperosmolar coma (NKHHC): Secondary | ICD-10-CM

## 2020-11-08 ENCOUNTER — Ambulatory Visit: Payer: BC Managed Care – PPO | Admitting: Physician Assistant

## 2020-11-21 NOTE — Progress Notes (Signed)
Established patient visit   Patient: Stacie Fleming   DOB: January 06, 1961   60 y.o. Female  MRN: [id number] Visit Date: 11/22/2020  Today's healthcare provider: Trinna Post, PA-C   Chief Complaint  Patient presents with  . Hypertension  . Diabetes  I,Adriana M Pollak,acting as a scribe for [employer], PA-C.,have documented all relevant documentation on the behalf of Trinna Post, PA-C,as directed by  Trinna Post, PA-C while in the presence of Trinna Post, PA-C.  Subjective    HPI   Moving to [facility] on 12/17/2020. She plans to establish with PCP down there.   Hypertension, follow-up  BP Readings from Last 3 Encounters:  11/22/20 135/66  08/02/20 120/82  05/20/20 (!) 142/81   Wt Readings from Last 3 Encounters:  11/22/20 189 lb 6.4 oz (85.9 kg)  08/02/20 190 lb 9.6 oz (86.5 kg)  05/20/20 192 lb (87.1 kg)     She was last seen for hypertension 3 months ago.  BP at that visit was 120/82. Management since that visit includes continue current medicaion.  She reports good compliance with treatment. She is not having side effects.  She is following a Regular diet. She is not exercising. She does not smoke.  Use of agents associated with hypertension: none.   Outside blood pressures are not being checked. Symptoms: No chest pain No chest pressure  No palpitations No syncope  No dyspnea No orthopnea  No paroxysmal nocturnal dyspnea No lower extremity edema   Pertinent labs: Lab Results  Component Value Date   CHOL 165 08/02/2020   HDL 41 08/02/2020   LDLCALC 73 08/02/2020   LDLDIRECT 84 07/26/2018   TRIG 315 (H) 08/02/2020   CHOLHDL 4.0 08/02/2020   Lab Results  Component Value Date   NA 139 08/02/2020   K 4.5 08/02/2020   CREATININE 0.84 08/02/2020   GFRNONAA 76 08/02/2020   GFRAA 88 08/02/2020   GLUCOSE 85 08/02/2020     The 10-year ASCVD risk score Mikey Bussing DC Jr., et al., 2013) is: 19.6%    --------------------------------------------------------------------------------------------------- Diabetes Mellitus Type II, Follow-up  Lab Results  Component Value Date   HGBA1C 8.4 (A) 11/22/2020   HGBA1C 8.5 (H) 08/02/2020   HGBA1C 7.5 (A) 05/02/2020   Wt Readings from Last 3 Encounters:  11/22/20 189 lb 6.4 oz (85.9 kg)  08/02/20 190 lb 9.6 oz (86.5 kg)  05/20/20 192 lb (87.1 kg)   Last seen for diabetes 3 months ago.  Management since then includes continue current medication. She reports good compliance with treatment. She is not having side effects.  Symptoms: No fatigue No foot ulcerations  No appetite changes No nausea  No paresthesia of the feet  No polydipsia  No polyuria No visual disturbances   No vomiting     Home blood sugar records: not being checked  Episodes of hypoglycemia? No    Current insulin regiment:none Most Recent Eye Exam: 09/22/2019 Current exercise: no regular exercise Current diet habits: well balanced  Pertinent Labs: Lab Results  Component Value Date   CHOL 165 08/02/2020   HDL 41 08/02/2020   LDLCALC 73 08/02/2020   LDLDIRECT 84 07/26/2018   TRIG 315 (H) 08/02/2020   CHOLHDL 4.0 08/02/2020   Lab Results  Component Value Date   NA 139 08/02/2020   K 4.5 08/02/2020   CREATININE 0.84 08/02/2020   GFRNONAA 76 08/02/2020   GFRAA 88 08/02/2020   GLUCOSE 85 08/02/2020     ---------------------------------------------------------------------------------------------------  Lipid/Cholesterol, Follow-up  Last lipid panel Other pertinent labs  Lab Results  Component Value Date   CHOL 165 08/02/2020   HDL 41 08/02/2020   LDLCALC 73 08/02/2020   LDLDIRECT 84 07/26/2018   TRIG 315 (H) 08/02/2020   CHOLHDL 4.0 08/02/2020   Lab Results  Component Value Date   ALT 22 08/02/2020   AST 16 08/02/2020   PLT 272 08/02/2020   TSH 1.910 08/02/2020     She was last seen for this 3 months ago.  Management since that visit includes  Continue statin.  She reports excellent compliance with treatment. She is not having side effects.   Symptoms: No chest pain No chest pressure/discomfort  No dyspnea No lower extremity edema  No numbness or tingling of extremity No orthopnea  No palpitations No paroxysmal nocturnal dyspnea  No speech difficulty No syncope   Current diet: in general, an "unhealthy" diet Current exercise: none  The 10-year ASCVD risk score Mikey Bussing DC Jr., et al., 2013) is: 19.6%  ---------------------------------------------------------------------------------------------------     Medications: Outpatient Medications Prior to Visit  Medication Sig  . amLODipine (NORVASC) 10 MG tablet TAKE ONE TABLET BY MOUTH ONE TIME DAILY  . atorvastatin (LIPITOR) 20 MG tablet TAKE ONE TABLET BY MOUTH ONE TIME DAILY  . FARXIGA 10 MG TABS tablet TAKE ONE TABLET BY MOUTH ONE TIME DAILY BEFORE BREAKFAST  . gabapentin (NEURONTIN) 300 MG capsule TAKE ONE CAPSULE BY MOUTH THREE TIMES A DAY  . glipiZIDE (GLUCOTROL XL) 10 MG 24 hr tablet TAKE ONE TABLET BY MOUTH ONE TIME DAILY WITH BREAKFAST FOR 30 DAYS THEN INCREASE TO TAKE TWO TABLETS BY MOUTH ONE TIME DAILY WITH BREAKFAST  . ibuprofen (ADVIL,MOTRIN) 200 MG tablet Take 200 mg by mouth every 6 (six) hours as needed.  Marland Kitchen JANUVIA 100 MG tablet TAKE ONE TABLET BY MOUTH ONE TIME DAILY  . lisinopril (ZESTRIL) 10 MG tablet TAKE ONE TABLET BY MOUTH ONE TIME DAILY  . metFORMIN (GLUCOPHAGE) 1000 MG tablet TAKE ONE TABLET BY MOUTH TWICE A DAY WITH A MEAL   No facility-administered medications prior to visit.    Review of Systems  Constitutional: Negative.   Respiratory: Negative.   Cardiovascular: Negative.   Hematological: Negative.        Objective    BP 135/66 (BP Location: Left Arm, Patient Position: Sitting, Cuff Size: Large)   Pulse 64   Temp 98.2 F (36.8 C) (Oral)   Wt 189 lb 6.4 oz (85.9 kg)   SpO2 100%   BMI 31.52 kg/m     Physical Exam Constitutional:       Appearance: Normal appearance. She is obese.  Cardiovascular:     Rate and Rhythm: Normal rate and regular rhythm.     Heart sounds: Normal heart sounds.  Pulmonary:     Effort: Pulmonary effort is normal.     Breath sounds: Normal breath sounds.  Skin:    General: Skin is warm and dry.  Neurological:     General: No focal deficit present.     Mental Status: She is alert and oriented to person, place, and time.  Psychiatric:        Mood and Affect: Mood normal.        Behavior: Behavior normal.       Results for orders placed or performed in visit on 11/22/20  POCT glycosylated hemoglobin (Hb A1C)  Result Value Ref Range   Hemoglobin A1C 8.4 (A) 4.0 - 5.6 %   HbA1c POC (<>  result, manual entry)     HbA1c, POC (prediabetic range)     HbA1c, POC (controlled diabetic range)      Assessment & Plan    1. Type 2 diabetes mellitus with hyperosmolarity without coma, unspecified whether long term insulin use ([place])  She does not want to make any changes right now with the move. She will establish with new PCP in [place] to work on this. May consider adding ozempic with new provider. She has not tolerated trulicity in the past.   - POCT glycosylated hemoglobin (Hb A1C)  2. Hypertension associated with diabetes ([place])  Continue with medications.   3. Hyperlipidemia, unspecified hyperlipidemia type  Continue cholesterol medication.    Return if symptoms worsen or fail to improve.      ITrinna Post, PA-C, have reviewed all documentation for this visit. The documentation on 11/22/20 for the exam, diagnosis, procedures, and orders are all accurate and complete.  The entirety of the information documented in the History of Present Illness, Review of Systems and Physical Exam were personally obtained by me. Portions of this information were initially documented by [facility] and reviewed by me for thoroughness and accuracy.     Paulene Floor  [facility] [phone] (phone) [phone] (fax)  [place]

## 2020-11-22 ENCOUNTER — Other Ambulatory Visit: Payer: Self-pay

## 2020-11-22 ENCOUNTER — Encounter: Payer: Self-pay | Admitting: Physician Assistant

## 2020-11-22 ENCOUNTER — Ambulatory Visit: Payer: BC Managed Care – PPO | Admitting: Physician Assistant

## 2020-11-22 VITALS — BP 135/66 | HR 64 | Temp 98.2°F | Wt 189.4 lb

## 2020-11-22 DIAGNOSIS — E785 Hyperlipidemia, unspecified: Secondary | ICD-10-CM | POA: Diagnosis not present

## 2020-11-22 DIAGNOSIS — I152 Hypertension secondary to endocrine disorders: Secondary | ICD-10-CM

## 2020-11-22 DIAGNOSIS — E1159 Type 2 diabetes mellitus with other circulatory complications: Secondary | ICD-10-CM

## 2020-11-22 DIAGNOSIS — E1100 Type 2 diabetes mellitus with hyperosmolarity without nonketotic hyperglycemic-hyperosmolar coma (NKHHC): Secondary | ICD-10-CM | POA: Diagnosis not present

## 2020-11-22 LAB — POCT GLYCOSYLATED HEMOGLOBIN (HGB A1C): Hemoglobin A1C: 8.4 % — AB (ref 4.0–5.6)

## 2020-11-22 NOTE — Patient Instructions (Signed)
Diabetes Mellitus and Exercise Exercising regularly is important for overall health, especially for people who have diabetes mellitus. Exercising is not only about losing weight. It has many other health benefits, such as increasing muscle strength and bone density and reducing body fat and stress. This leads to improved fitness, flexibility, and endurance, all of which result in better overall health. What are the benefits of exercise if I have diabetes? Exercise has many benefits for people with diabetes. They include:  Helping to lower and control blood sugar (glucose).  Helping the body to respond better to the hormone insulin by improving insulin sensitivity.  Reducing how much insulin the body needs.  Lowering the risk for heart disease by: ? Lowering "bad" cholesterol and triglyceride levels. ? Increasing "good" cholesterol levels. ? Lowering blood pressure. ? Lowering blood glucose levels. What is my activity plan? Your health care provider or certified diabetes educator can help you make a plan for the type and frequency of exercise that works for you. This is called your activity plan. Be sure to:  Get at least 150 minutes of medium-intensity or high-intensity exercise each week. Exercises may include brisk walking, biking, or water aerobics.  Do stretching and strengthening exercises, such as yoga or weight lifting, at least 2 times a week.  Spread out your activity over at least 3 days of the week.  Get some form of physical activity each day. ? Do not go more than 2 days in a row without some kind of physical activity. ? Avoid being inactive for more than 90 minutes at a time. Take frequent breaks to walk or stretch.  Choose exercises or activities that you enjoy. Set realistic goals.  Start slowly and gradually increase your exercise intensity over time.   How do I manage my diabetes during exercise? Monitor your blood glucose  Check your blood glucose before and  after exercising. If your blood glucose is: ? 240 mg/dL (13.3 mmol/L) or higher before you exercise, check your urine for ketones. These are chemicals created by the liver. If you have ketones in your urine, do not exercise until your blood glucose returns to normal. ? 100 mg/dL (5.6 mmol/L) or lower, eat a snack containing 15-20 grams of carbohydrate. Check your blood glucose 15 minutes after the snack to make sure that your glucose level is above 100 mg/dL (5.6 mmol/L) before you start your exercise.  Know the symptoms of low blood glucose (hypoglycemia) and how to treat it. Your risk for hypoglycemia increases during and after exercise. Follow these tips and your health care provider's instructions  Keep a carbohydrate snack that is fast-acting for use before, during, and after exercise to help prevent or treat hypoglycemia.  Avoid injecting insulin into areas of the body that are going to be exercised. For example, avoid injecting insulin into: ? Your arms, when you are about to play tennis. ? Your legs, when you are about to go jogging.  Keep records of your exercise habits. Doing this can help you and your health care provider adjust your diabetes management plan as needed. Write down: ? Food that you eat before and after you exercise. ? Blood glucose levels before and after you exercise. ? The type and amount of exercise you have done.  Work with your health care provider when you start a new exercise or activity. He or she may need to: ? Make sure that the activity is safe for you. ? Adjust your insulin, other medicines, and food that   you eat.  Drink plenty of water while you exercise. This prevents loss of water (dehydration) and problems caused by a lot of heat in the body (heat stroke).   Where to find more information  American Diabetes Association: www.diabetes.org Summary  Exercising regularly is important for overall health, especially for people who have diabetes  mellitus.  Exercising has many health benefits. It increases muscle strength and bone density and reduces body fat and stress. It also lowers and controls blood glucose.  Your health care provider or certified diabetes educator can help you make an activity plan for the type and frequency of exercise that works for you.  Work with your health care provider to make sure any new activity is safe for you. Also work with your health care provider to adjust your insulin, other medicines, and the food you eat. This information is not intended to replace advice given to you by your health care provider. Make sure you discuss any questions you have with your health care provider. Document Revised: 07/03/2019 Document Reviewed: 07/03/2019 Elsevier Patient Education  2021 Elsevier Inc.  

## 2020-11-25 ENCOUNTER — Other Ambulatory Visit: Payer: Self-pay | Admitting: Physician Assistant

## 2020-11-25 DIAGNOSIS — E1100 Type 2 diabetes mellitus with hyperosmolarity without nonketotic hyperglycemic-hyperosmolar coma (NKHHC): Secondary | ICD-10-CM

## 2020-11-28 ENCOUNTER — Other Ambulatory Visit: Payer: Self-pay | Admitting: Physician Assistant

## 2020-11-28 DIAGNOSIS — E785 Hyperlipidemia, unspecified: Secondary | ICD-10-CM

## 2021-01-24 ENCOUNTER — Other Ambulatory Visit: Payer: Self-pay | Admitting: Physician Assistant

## 2021-01-24 DIAGNOSIS — I152 Hypertension secondary to endocrine disorders: Secondary | ICD-10-CM

## 2021-01-24 DIAGNOSIS — E1100 Type 2 diabetes mellitus with hyperosmolarity without nonketotic hyperglycemic-hyperosmolar coma (NKHHC): Secondary | ICD-10-CM

## 2021-01-24 DIAGNOSIS — E1159 Type 2 diabetes mellitus with other circulatory complications: Secondary | ICD-10-CM

## 2021-02-24 ENCOUNTER — Telehealth: Payer: Self-pay | Admitting: Physician Assistant

## 2021-02-24 DIAGNOSIS — E1100 Type 2 diabetes mellitus with hyperosmolarity without nonketotic hyperglycemic-hyperosmolar coma (NKHHC): Secondary | ICD-10-CM

## 2021-02-24 MED ORDER — SITAGLIPTIN PHOSPHATE 100 MG PO TABS: 100.0000 mg | ORAL_TABLET | Freq: Every day | ORAL | 1 refills | Status: DC

## 2021-02-24 MED ORDER — DAPAGLIFLOZIN PROPANEDIOL 10 MG PO TABS: 10.0000 mg | ORAL_TABLET | Freq: Every day | ORAL | 1 refills | Status: DC

## 2021-02-24 NOTE — Telephone Encounter (Signed)
[facility] faxed refill request for the following medications:  1. JANUVIA 100 MG tablet  2. FARXIGA 10 MG TABS tablet  Last Rx: 08/07/20 LOV: 11/22/20 Please advise. Thanks TNP

## 2021-04-24 ENCOUNTER — Telehealth: Payer: Self-pay | Admitting: Physician Assistant

## 2021-04-24 NOTE — Telephone Encounter (Signed)
Last office visit 11/22/2020  providers notes states patient didn't want to make any changes with her T2DM medicine because she was going to establish with a new PCP in [place] to verify before sending RX refills. If patient calls back ok for [facility] nurse to review and ask patient.

## 2021-04-24 NOTE — Telephone Encounter (Signed)
[facility] faxed refill request for the following medications:  lisinopril (ZESTRIL) 10 MG tablet   amLODipine (NORVASC) 10 MG tablet   metFORMIN (GLUCOPHAGE) 1000 MG tablet    Please advise.

## 2021-05-02 IMAGING — CR DG FOOT 2V*L*
1 series · 2 of 2 positions shown · non-contrast
Comparison: None.

CLINICAL DATA: 59-year-old female with left foot pain, lateral
aspect pain and intermittent "protrusion ".

EXAM:
LEFT FOOT - 2 VIEW

[Series 1: dg foot 2 views left · 0.14mm/px · 2 of 2 slices shown]
[im 1/2]
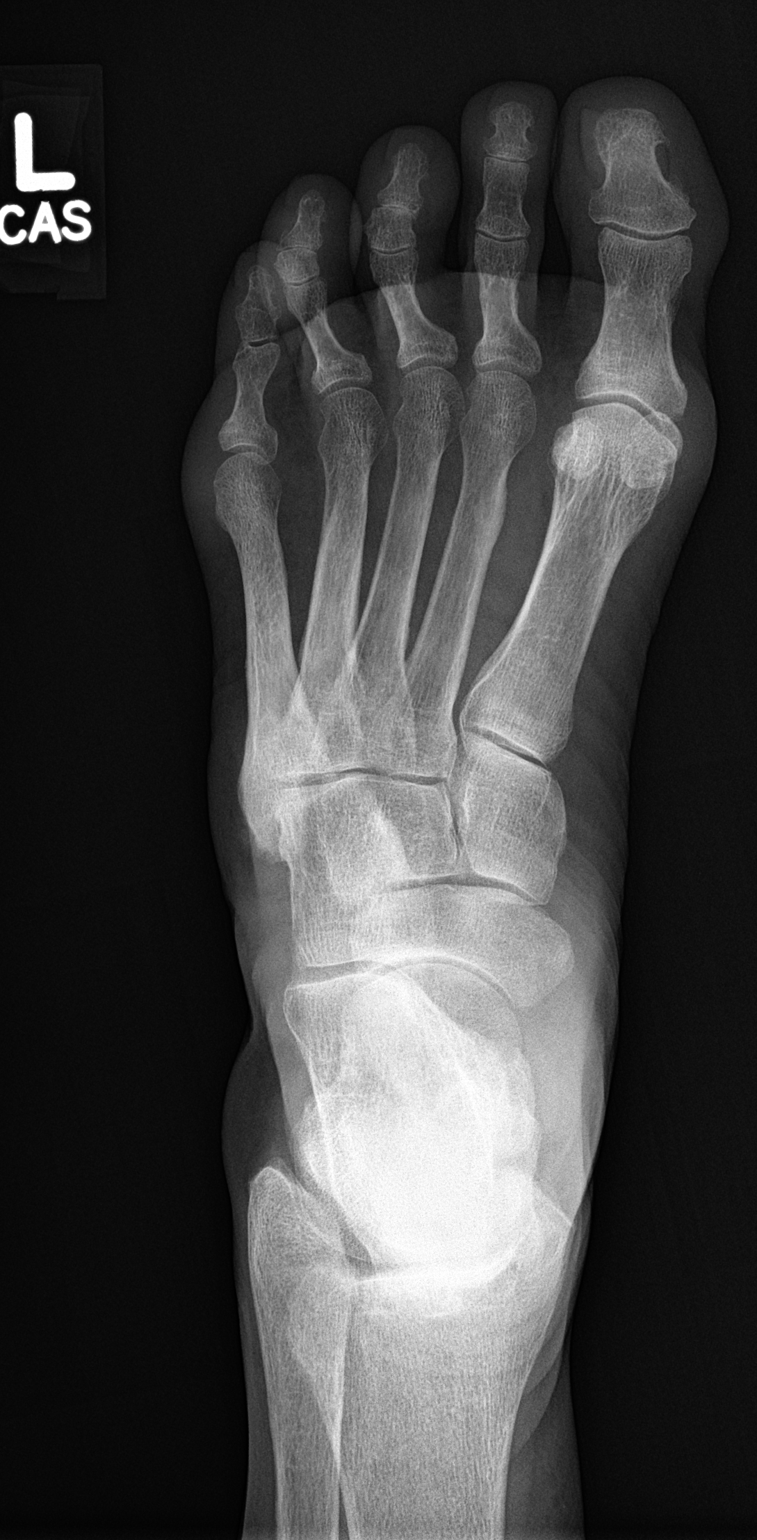
[im 2/2]
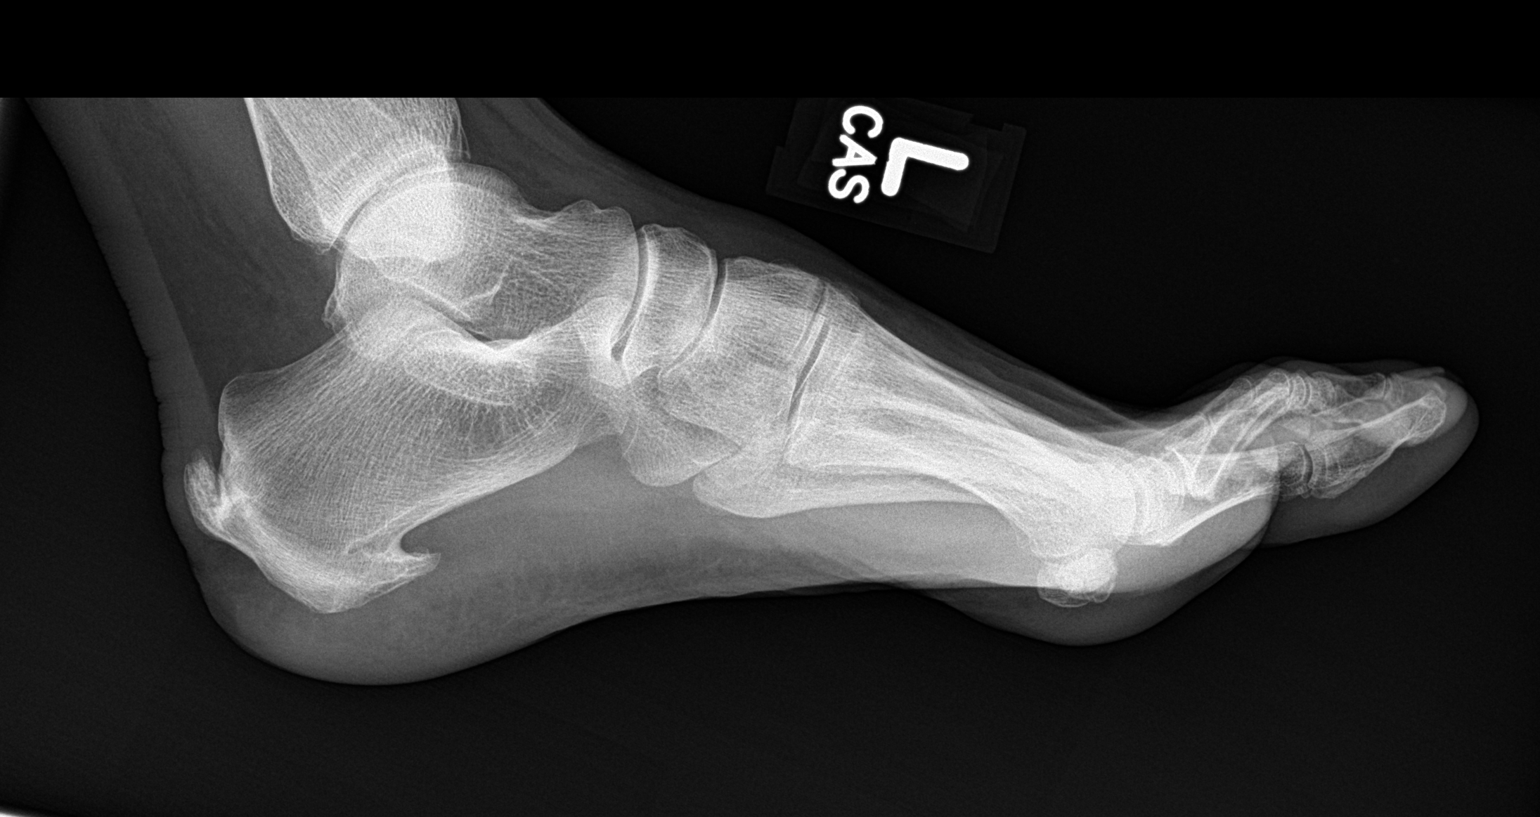

[2 of 2 positions shown; findings below may reference images not displayed]

FINDINGS: Bone mineralization is within normal limits. There is prominent
degenerative osteophytosis at the calcaneus. Tarsal bones appear
intact and normally aligned. Metatarsals appear intact. Metatarsal
and distal joint spaces are within normal limits. No acute osseous
abnormality identified. No discrete soft tissue abnormality.
IMPRESSION: No acute osseous abnormality identified. Mild for age degenerative
changes in the left foot primarily at the calcaneus.

## 2021-05-05 NOTE — Telephone Encounter (Signed)
Pharmacy has faxed another Rx request for medications below.

## 2021-05-06 ENCOUNTER — Other Ambulatory Visit: Payer: Self-pay

## 2021-05-06 DIAGNOSIS — I152 Hypertension secondary to endocrine disorders: Secondary | ICD-10-CM

## 2021-05-06 DIAGNOSIS — E1159 Type 2 diabetes mellitus with other circulatory complications: Secondary | ICD-10-CM

## 2021-05-06 DIAGNOSIS — E1100 Type 2 diabetes mellitus with hyperosmolarity without nonketotic hyperglycemic-hyperosmolar coma (NKHHC): Secondary | ICD-10-CM

## 2021-05-06 NOTE — Telephone Encounter (Signed)
[facility] faxed refill request for the following medications:  metFORMIN (GLUCOPHAGE) 1000 MG tablet lisinopril (ZESTRIL) 10 MG tablet amLODipine (NORVASC) 10 MG tablet  Please advise. Thanks TNP

## 2021-05-06 NOTE — Telephone Encounter (Signed)
LMTCB 05/06/2021.  PEC please advise if pt is still in the area.  If she is still a pt here she needs to schedule an office visit.   Thanks,   -Mickel Baas

## 2021-05-09 MED ORDER — LISINOPRIL 10 MG PO TABS: 10.0000 mg | ORAL_TABLET | Freq: Every day | ORAL | 0 refills | Status: DC

## 2021-05-09 MED ORDER — METFORMIN HCL 1000 MG PO TABS: 1000.0000 mg | ORAL_TABLET | Freq: Two times a day (BID) | ORAL | 0 refills | Status: DC

## 2021-05-09 MED ORDER — AMLODIPINE BESYLATE 10 MG PO TABS: 10.0000 mg | ORAL_TABLET | Freq: Every day | ORAL | 0 refills | Status: DC

## 2021-05-09 NOTE — Telephone Encounter (Signed)
Rx refilled, but needs to reestablish with new PCP for further refills

## 2021-05-09 NOTE — Telephone Encounter (Signed)
Left message for patient to call back for an appt.

## 2021-05-09 NOTE — Telephone Encounter (Signed)
LOV was 11/22/2020 with Adriana. Ok to refill? Please advise. Thanks!

## 2021-06-13 ENCOUNTER — Telehealth: Payer: Self-pay | Admitting: Physician Assistant

## 2021-06-13 DIAGNOSIS — E1100 Type 2 diabetes mellitus with hyperosmolarity without nonketotic hyperglycemic-hyperosmolar coma (NKHHC): Secondary | ICD-10-CM

## 2021-06-13 MED ORDER — GLIPIZIDE ER 10 MG PO TB24: 10.0000 mg | ORAL_TABLET | Freq: Two times a day (BID) | ORAL | 0 refills | Status: DC

## 2021-06-13 NOTE — Telephone Encounter (Signed)
[facility] faxed refill request for the following medications:    glipiZIDE (GLUCOTROL XL) 10 MG 24 hr tablet   Please advise.

## 2021-07-14 ENCOUNTER — Other Ambulatory Visit: Payer: Self-pay

## 2021-07-14 ENCOUNTER — Telehealth: Payer: Self-pay

## 2021-07-14 DIAGNOSIS — E785 Hyperlipidemia, unspecified: Secondary | ICD-10-CM

## 2021-07-14 MED ORDER — ATORVASTATIN CALCIUM 20 MG PO TABS: 20.0000 mg | ORAL_TABLET | Freq: Every day | ORAL | 0 refills | Status: DC

## 2021-07-14 NOTE — Telephone Encounter (Signed)
[facility] faxed refill request for the following medications:  atorvastatin (LIPITOR) 20 MG tablet   Please advise.

## 2021-07-17 ENCOUNTER — Encounter: Payer: Self-pay | Admitting: Gastroenterology

## 2021-08-12 ENCOUNTER — Other Ambulatory Visit: Payer: Self-pay | Admitting: Family Medicine

## 2021-08-12 DIAGNOSIS — E1159 Type 2 diabetes mellitus with other circulatory complications: Secondary | ICD-10-CM

## 2021-08-12 DIAGNOSIS — E785 Hyperlipidemia, unspecified: Secondary | ICD-10-CM

## 2021-08-12 DIAGNOSIS — I152 Hypertension secondary to endocrine disorders: Secondary | ICD-10-CM

## 2021-08-12 NOTE — Telephone Encounter (Signed)
[facility] faxed refill request for the following medications:   metFORMIN (GLUCOPHAGE) 1000 MG tablet   Please advise.

## 2021-08-12 NOTE — Telephone Encounter (Signed)
Requested medications are due for refill today yes  Requested medications are on the active medication list yes  Last refill 05/09/21  Last visit 11/22/20  Future visit scheduled no, already had curtesy refill  Notes to clinic had curtesy refill, labs greater than 180 days ago, 08/02/20, no upcoming appt.  Requested Prescriptions  Pending Prescriptions Disp Refills   lisinopril (ZESTRIL) 10 MG tablet [Pharmacy Med Name: LISINOPRIL 10 MG TAB[*]] 90 tablet 0    Sig: TAKE ONE TABLET BY MOUTH ONE TIME DAILY     Cardiovascular:  ACE Inhibitors Failed - 08/12/2021  1:55 AM      Failed - Cr in normal range and within 180 days    Creatinine, Ser  Date Value Ref Range Status  08/02/2020 0.84 0.57 - 1.00 mg/dL Final          Failed - K in normal range and within 180 days    Potassium  Date Value Ref Range Status  08/02/2020 4.5 3.5 - 5.2 mmol/L Final          Failed - Valid encounter within last 6 months    Recent Outpatient Visits           8 months ago Type 2 diabetes mellitus with hyperosmolarity without coma, unspecified whether long term insulin use [facility])   [facility] [place], [place], PA-C   1 year ago Annual physical exam   [facility] Car[place] Collet M, PA-C   1 year ago Type 2 diabetes mellitus with hyperosmolarity without coma, unspecified whether long term insulin use [facility])   [facility] [place], [place], PA-C   1 year ago Type 2 diabetes mellitus with hyperosmolarity without coma, unspecified whether long term insulin use [facility])   [facility] [place], [place], PA-C   1 year ago Type 2 diabetes mellitus with hyperosmolarity without coma, unspecified whether long term insulin use [facility])   [place], [place], [place] - Patient is not pregnant      Passed - Last BP in normal range    BP Readings from Last 1 Encounters:  11/22/20 135/66

## 2021-08-12 NOTE — Telephone Encounter (Signed)
Lab out of date, greater than 360 days, already had a curtesy refill, no upcoming appt scheduled.   Requested Prescriptions  Pending Prescriptions Disp Refills  . atorvastatin (LIPITOR) 20 MG tablet [Pharmacy Med Name: ATORVASTATIN 20 MG TAB[*]] 30 tablet 0    Sig: TAKE ONE TABLET BY MOUTH ONE TIME DAILY     Cardiovascular:  Antilipid - Statins Failed - 08/12/2021  1:55 AM      Failed - Total Cholesterol in normal range and within 360 days    Cholesterol, Total  Date Value Ref Range Status  08/02/2020 165 100 - 199 mg/dL Final         Failed - LDL in normal range and within 360 days    LDL Chol Calc (NIH)  Date Value Ref Range Status  08/02/2020 73 0 - 99 mg/dL Final   LDL Direct  Date Value Ref Range Status  07/26/2018 84 0 - 99 mg/dL Final         Failed - HDL in normal range and within 360 days    HDL  Date Value Ref Range Status  08/02/2020 41 >39 mg/dL Final         Failed - Triglycerides in normal range and within 360 days    Triglycerides  Date Value Ref Range Status  08/02/2020 315 (H) 0 - 149 mg/dL Final         Passed - Patient is not pregnant      Passed - Valid encounter within last 12 months    Recent Outpatient Visits          8 months ago Type 2 diabetes mellitus with hyperosmolarity without coma, unspecified whether long term insulin use [facility])   [facility] [place], [place], PA-C   1 year ago Annual physical exam   [employer], Adriana M, PA-C   1 year ago Type 2 diabetes mellitus with hyperosmolarity without coma, unspecified whether long term insulin use [facility])   [facility] [place], [place], PA-C   1 year ago Type 2 diabetes mellitus with hyperosmolarity without coma, unspecified whether long term insulin use [facility]   [place], [place], PA-C   1 year ago Type 2 diabetes mellitus with hyperosmolarity without coma, unspecified whether long term insulin use [facility])    [place], [place], [place]

## 2021-09-04 ENCOUNTER — Other Ambulatory Visit: Payer: Self-pay | Admitting: Family Medicine

## 2021-09-04 DIAGNOSIS — I152 Hypertension secondary to endocrine disorders: Secondary | ICD-10-CM

## 2021-09-04 NOTE — Telephone Encounter (Signed)
Requested medication (s) are due for refill today: yes   Requested medication (s) are on the active medication list: yes   Last refill:  05/09/21 #90 0 refills  Future visit scheduled: no   Notes to clinic:  greater than 3 month encounter. Do you want to give courtesy refill #30 or give #90? Called patient to schedule appt . No answer , LVMTCB.     Requested Prescriptions  Pending Prescriptions Disp Refills   amLODipine (NORVASC) 10 MG tablet [Pharmacy Med Name: AMLODIPINE 10 MG TAB[*]] 90 tablet 0    Sig: TAKE ONE TABLET BY MOUTH ONE TIME DAILY     Cardiovascular:  Calcium Channel Blockers Failed - 09/04/2021  9:44 AM      Failed - Valid encounter within last 6 months    Recent Outpatient Visits           9 months ago Type 2 diabetes mellitus with hyperosmolarity without coma, unspecified whether long term insulin use [facility])   [facility] [place], [place], [place]   1 year ago Annual physical exam   [facility] Carles Collet M, PA-C   1 year ago Type 2 diabetes mellitus with hyperosmolarity without coma, unspecified whether long term insulin use [facility])   [facility] [place], [place], PA-C   1 year ago Type 2 diabetes mellitus with hyperosmolarity without coma, unspecified whether long term insulin use [facility])   [facility] [place], [place], [place]   1 year ago Type 2 diabetes mellitus with hyperosmolarity without coma, unspecified whether long term insulin use [facility])   [place], [place]              Passed - Last BP in normal range    BP Readings from Last 1 Encounters:  11/22/20 135/66

## 2021-09-04 NOTE — Telephone Encounter (Signed)
Called patient to schedule appt for further refills. No answer, left message to call back [phone].

## 2021-09-11 ENCOUNTER — Other Ambulatory Visit: Payer: Self-pay | Admitting: Family Medicine

## 2021-09-11 DIAGNOSIS — E785 Hyperlipidemia, unspecified: Secondary | ICD-10-CM

## 2021-09-11 DIAGNOSIS — E1100 Type 2 diabetes mellitus with hyperosmolarity without nonketotic hyperglycemic-hyperosmolar coma (NKHHC): Secondary | ICD-10-CM

## 2021-09-11 NOTE — Telephone Encounter (Signed)
Requested medications are due for refill today.  yes  Requested medications are on the active medications list.  yes  Last refill. 02/24/2021 for faxiga  and Januvia. 07/14/2021 for Lipitor  Future visit scheduled.   no  Notes to clinic.  Pt has been given courtesy refills and told that for further refills she needs to establish with a new provider, (per note of 05/06/2021)

## 2021-09-15 ENCOUNTER — Telehealth: Payer: Self-pay | Admitting: Physician Assistant

## 2021-09-15 DIAGNOSIS — E1100 Type 2 diabetes mellitus with hyperosmolarity without nonketotic hyperglycemic-hyperosmolar coma (NKHHC): Secondary | ICD-10-CM

## 2021-09-15 MED ORDER — GLIPIZIDE ER 10 MG PO TB24: 10.0000 mg | ORAL_TABLET | Freq: Two times a day (BID) | ORAL | 0 refills | Status: DC

## 2021-09-15 NOTE — Addendum Note (Signed)
Addended by: Ashley Royalty E on: 09/15/2021 09:47 AM   Modules accepted: Orders

## 2021-09-15 NOTE — Telephone Encounter (Signed)
[facility] faxed refill request for the following medications:   glipiZIDE (GLUCOTROL XL) 10 MG 24 hr tablet   Please advise.

## 2021-09-18 NOTE — Telephone Encounter (Signed)
Patient needs an appointment for future refills.  Please schedule. Last seen 11/22/20.

## 2021-09-24 NOTE — Telephone Encounter (Signed)
Left message for patient to call back to get appt for refills.   PEC can schedule with provider.

## 2021-10-11 ENCOUNTER — Other Ambulatory Visit: Payer: Self-pay | Admitting: Family Medicine

## 2021-10-11 DIAGNOSIS — E785 Hyperlipidemia, unspecified: Secondary | ICD-10-CM

## 2021-10-11 DIAGNOSIS — E1100 Type 2 diabetes mellitus with hyperosmolarity without nonketotic hyperglycemic-hyperosmolar coma (NKHHC): Secondary | ICD-10-CM

## 2021-10-11 NOTE — Telephone Encounter (Signed)
Requested medication (s) are due for refill today: yes  Requested medication (s) are on the active medication list: yes  Last refill:  atorvastatin: 07/14/21 #30        Farxiga: 02/24/21 #90 1 RF             Januvia: 02/24/21 #90 1 RF  Future visit scheduled:  no  called pt and LM on VM to call back to make appt  Notes to clinic:  pt needs OV-    Requested Prescriptions  Pending Prescriptions Disp Refills   atorvastatin (LIPITOR) 20 MG tablet [Pharmacy Med Name: ATORVASTATIN 20 MG TAB[*]] 30 tablet 0    Sig: TAKE ONE TABLET BY MOUTH ONE TIME DAILY     Cardiovascular:  Antilipid - Statins Failed - 10/11/2021  1:56 AM      Failed - Total Cholesterol in normal range and within 360 days    Cholesterol, Total  Date Value Ref Range Status  08/02/2020 165 100 - 199 mg/dL Final          Failed - LDL in normal range and within 360 days    LDL Chol Calc (NIH)  Date Value Ref Range Status  08/02/2020 73 0 - 99 mg/dL Final   LDL Direct  Date Value Ref Range Status  07/26/2018 84 0 - 99 mg/dL Final          Failed - HDL in normal range and within 360 days    HDL  Date Value Ref Range Status  08/02/2020 41 >39 mg/dL Final          Failed - Triglycerides in normal range and within 360 days    Triglycerides  Date Value Ref Range Status  08/02/2020 315 (H) 0 - 149 mg/dL Final          Passed - Patient is not pregnant      Passed - Valid encounter within last 12 months    Recent Outpatient Visits           10 months ago Type 2 diabetes mellitus with hyperosmolarity without coma, unspecified whether long term insulin use ([place])   [facility] [place], [place], PA-C   1 year ago Annual physical exam   [employer], Adriana M, PA-C   1 year ago Type 2 diabetes mellitus with hyperosmolarity without coma, unspecified whether long term insulin use [facility])   [facility] [place], [place], PA-C   1 year ago Type 2 diabetes mellitus with  hyperosmolarity without coma, unspecified whether long term insulin use [facility])   [place], [place], PA-C   1 year ago Type 2 diabetes mellitus with hyperosmolarity without coma, unspecified whether long term insulin use ([place])   [facility], [place], PA-C               FARXIGA 10 MG TABS tablet [Pharmacy Med Name: FARXIGA 10 MG TAB[**$]] 90 tablet 1    Sig: TAKE ONE TABLET BY MOUTH ONE TIME DAILY     Endocrinology:  Diabetes - SGLT2 Inhibitors Failed - 10/11/2021  1:56 AM      Failed - Cr in normal range and within 360 days    Creatinine, Ser  Date Value Ref Range Status  08/02/2020 0.84 0.57 - 1.00 mg/dL Final          Failed - LDL in normal range and within 360 days    LDL Chol Calc (NIH)  Date Value Ref  Range Status  08/02/2020 73 0 - 99 mg/dL Final   LDL Direct  Date Value Ref Range Status  07/26/2018 84 0 - 99 mg/dL Final          Failed - HBA1C is between 0 and 7.9 and within 180 days    Hemoglobin A1C  Date Value Ref Range Status  11/22/2020 8.4 (A) 4.0 - 5.6 % Final   Hgb A1c MFr Bld  Date Value Ref Range Status  08/02/2020 8.5 (H) 4.8 - 5.6 % Final    Comment:             Prediabetes: 5.7 - 6.4          Diabetes: >6.4          Glycemic control for adults with diabetes: <7.0           Failed - eGFR in normal range and within 360 days    GFR calc Af Amer  Date Value Ref Range Status  08/02/2020 88 >59 mL/min/1.73 Final    Comment:    **In accordance with recommendations from the NKF-ASN Task force,**   [facility] is in the process of updating its eGFR calculation to the   2021 CKD-EPI creatinine equation that estimates kidney function   without a race variable.    GFR calc non Af Amer  Date Value Ref Range Status  08/02/2020 76 >59 mL/min/1.73 Final          Failed - Valid encounter within last 6 months    Recent Outpatient Visits           10 months ago Type 2 diabetes mellitus with  hyperosmolarity without coma, unspecified whether long term insulin use [facility])   [facility] [place], Fabio Bering M, [place]   1 year ago Annual physical exam   [facility] Car[place] Collet M, PA-C   1 year ago Type 2 diabetes mellitus with hyperosmolarity without coma, unspecified whether long term insulin use [facility])   [facility] [place], [place] M, PA-C   1 year ago Type 2 diabetes mellitus with hyperosmolarity without coma, unspecified whether long term insulin use [facility])   [facility] [place], [place] M, PA-C   1 year ago Type 2 diabetes mellitus with hyperosmolarity without coma, unspecified whether long term insulin use ([place])   [facility], Adriana M, PA-C               JANUVIA 100 MG tablet [Pharmacy Med Name: JANUVIA 100 MG TAB[*$]] 90 tablet 1    Sig: TAKE ONE TABLET BY MOUTH ONE TIME DAILY     Endocrinology:  Diabetes - DPP-4 Inhibitors Failed - 10/11/2021  1:56 AM      Failed - HBA1C is between 0 and 7.9 and within 180 days    Hemoglobin A1C  Date Value Ref Range Status  11/22/2020 8.4 (A) 4.0 - 5.6 % Final   Hgb A1c MFr Bld  Date Value Ref Range Status  08/02/2020 8.5 (H) 4.8 - 5.6 % Final    Comment:             Prediabetes: 5.7 - 6.4          Diabetes: >6.4          Glycemic control for adults with diabetes: <7.0           Failed - Cr in normal range and within 360 days    Creatinine, Ser  Date Value Ref Range Status  08/02/2020 0.84 0.57 - 1.00 mg/dL Final          Failed - Valid encounter within last 6 months    Recent Outpatient Visits           10 months ago Type 2 diabetes mellitus with hyperosmolarity without coma, unspecified whether long term insulin use [facility])   [facility] [place], [place], [place]   1 year ago Annual physical exam   [facility] Carles Collet M, PA-C   1 year ago Type 2 diabetes mellitus with hyperosmolarity without coma,  unspecified whether long term insulin use [facility])   [facility] [place], [place], PA-C   1 year ago Type 2 diabetes mellitus with hyperosmolarity without coma, unspecified whether long term insulin use [facility])   [facility] [place], [place], [place]   1 year ago Type 2 diabetes mellitus with hyperosmolarity without coma, unspecified whether long term insulin use [facility])   [place], [place], [place]

## 2021-11-06 ENCOUNTER — Encounter: Payer: Self-pay | Admitting: Physician Assistant

## 2021-11-06 ENCOUNTER — Other Ambulatory Visit: Payer: Self-pay

## 2021-11-06 ENCOUNTER — Ambulatory Visit (INDEPENDENT_AMBULATORY_CARE_PROVIDER_SITE_OTHER): Payer: 59 | Admitting: Physician Assistant

## 2021-11-06 VITALS — BP 132/73 | HR 78 | Temp 97.9°F | Wt 189.0 lb

## 2021-11-06 DIAGNOSIS — E1100 Type 2 diabetes mellitus with hyperosmolarity without nonketotic hyperglycemic-hyperosmolar coma (NKHHC): Secondary | ICD-10-CM

## 2021-11-06 DIAGNOSIS — E1159 Type 2 diabetes mellitus with other circulatory complications: Secondary | ICD-10-CM

## 2021-11-06 DIAGNOSIS — I152 Hypertension secondary to endocrine disorders: Secondary | ICD-10-CM

## 2021-11-06 DIAGNOSIS — E785 Hyperlipidemia, unspecified: Secondary | ICD-10-CM | POA: Diagnosis not present

## 2021-11-06 LAB — POCT GLYCOSYLATED HEMOGLOBIN (HGB A1C): Hemoglobin A1C: 10 % — AB (ref 4.0–5.6)

## 2021-11-06 MED ORDER — GLIPIZIDE ER 10 MG PO TB24: 10.0000 mg | ORAL_TABLET | Freq: Two times a day (BID) | ORAL | 2 refills | Status: AC

## 2021-11-06 MED ORDER — SITAGLIPTIN PHOSPHATE 100 MG PO TABS: 100.0000 mg | ORAL_TABLET | Freq: Every day | ORAL | 2 refills | Status: AC

## 2021-11-06 MED ORDER — DAPAGLIFLOZIN PROPANEDIOL 10 MG PO TABS: 10.0000 mg | ORAL_TABLET | Freq: Every day | ORAL | 3 refills | Status: AC

## 2021-11-06 MED ORDER — LISINOPRIL 10 MG PO TABS: 10.0000 mg | ORAL_TABLET | Freq: Every day | ORAL | 2 refills | Status: AC

## 2021-11-06 MED ORDER — METFORMIN HCL 1000 MG PO TABS: 1000.0000 mg | ORAL_TABLET | Freq: Two times a day (BID) | ORAL | 0 refills | Status: AC

## 2021-11-06 MED ORDER — ATORVASTATIN CALCIUM 20 MG PO TABS: 20.0000 mg | ORAL_TABLET | Freq: Every day | ORAL | 2 refills | Status: AC

## 2021-11-06 MED ORDER — AMLODIPINE BESYLATE 10 MG PO TABS: 10.0000 mg | ORAL_TABLET | Freq: Every day | ORAL | 2 refills | Status: AC

## 2021-11-06 NOTE — Patient Instructions (Signed)
I have sent in refills for your medications  We will need to see you back in 3 months to recheck your A1c and make sure it is coming back down- goal of <7%   Please take them as directed Please stay well hydrated and follow a diabetes -conscious diet plan Stay active every day

## 2021-11-06 NOTE — Assessment & Plan Note (Addendum)
Chronic, currently managed with lipitor Lipid panel ordered today for monitoring  Results to dictate further management Recommend lifestyle changes to assist with management Follow up in 3 months

## 2021-11-06 NOTE — Assessment & Plan Note (Addendum)
Chronic and managed with amlodipine, Lisinopril  Reports peripheral swelling after work and with driving  BP 128/11 today in office Recommend follow up in 3 months to discuss if adding HCTZ would be appropriate and acceptable to patient given her concerns of cramping  Refills provided for medications

## 2021-11-06 NOTE — Assessment & Plan Note (Addendum)
Chronic and currently uncontrolled  A1c 10.0% today - patient states she ran out of some of her medications several weeks ago Patient expresses concerns that current medications are not providing adequate control  Recommend that we stabilize with current medications before making adjustments  Refills provided Labs ordered: CMP, CBC, lipid panel Follow up in 3 months

## 2021-11-06 NOTE — Progress Notes (Signed)
Established patient visit   Patient: Stacie Fleming   DOB: 1960/12/04   61 y.o. Female  MRN: [id number] Visit Date: 11/06/2021  Today's healthcare provider: Dani Gobble Diella Gillingham, PA-C   Introduced myself to the patient as a Journalist, newspaper and provided education on APPs in clinical practice.    No chief complaint on file.  Subjective    HPI  Diabetes Mellitus Type II, follow-up  Lab Results  Component Value Date   HGBA1C 10.0 (A) 11/06/2021   HGBA1C 8.4 (A) 11/22/2020   HGBA1C 8.5 (H) 08/02/2020   Last seen for diabetes about a year ago.  Management since then includes continuing the same treatment. She reports poor compliance with treatment. She is not having side effects.   Home blood sugar records: trend: increasing steadily  Episodes of hypoglycemia? No    Current insulin regiment: None Most Recent Eye Exam: Pt is due for an eye exam  Reports she recently ran out of [place] - 3-4 weeks ago And ran out of Metformin last week  States blood sugars at home have been 160s + and in the 300s  States she does not wish to refill Trulicity as this made her sugars increase- Trulicity is not on current med list  States she is "game to try other options" as she is skeptical that her current regimen is providing adequate management   Reports she is having cramps and swelling in her legs  States swelling in her legs is more prevalent after working and driving    Last metabolic panel Lab Results  Component Value Date   GLUCOSE 85 08/02/2020   NA 139 08/02/2020   K 4.5 08/02/2020   BUN 16 08/02/2020   CREATININE 0.84 08/02/2020   GFRNONAA 76 08/02/2020   CALCIUM 10.1 08/02/2020   AST 16 08/02/2020   ALT 22 08/02/2020   The 10-year ASCVD risk score (Arnett DK, et al., 2019) is: 15.3%  ---------------------------------------------------------------------------------------------------   Medications: Outpatient Medications Prior to Visit  Medication Sig   gabapentin  (NEURONTIN) 300 MG capsule TAKE ONE CAPSULE BY MOUTH THREE TIMES A DAY (Patient not taking: Reported on 11/06/2021)   ibuprofen (ADVIL,MOTRIN) 200 MG tablet Take 200 mg by mouth every 6 (six) hours as needed. (Patient not taking: Reported on 11/06/2021)   [DISCONTINUED] amLODipine (NORVASC) 10 MG tablet TAKE ONE TABLET BY MOUTH ONE TIME DAILY (Patient not taking: Reported on 11/06/2021)   [DISCONTINUED] atorvastatin (LIPITOR) 20 MG tablet Take 1 tablet (20 mg total) by mouth daily. Please schedule office visit before any future refills (Patient not taking: Reported on 11/06/2021)   [DISCONTINUED] dapagliflozin propanediol (FARXIGA) 10 MG TABS tablet Take 1 tablet (10 mg total) by mouth daily. Please schedule office visit before any future refills (Patient not taking: Reported on 11/06/2021)   [DISCONTINUED] glipiZIDE (GLUCOTROL XL) 10 MG 24 hr tablet Take 1 tablet (10 mg total) by mouth 2 (two) times daily before a meal. Please schedule an office visit before anymore refills. (Patient not taking: Reported on 11/06/2021)   [DISCONTINUED] lisinopril (ZESTRIL) 10 MG tablet Take 1 tablet (10 mg total) by mouth daily. (Patient not taking: Reported on 11/06/2021)   [DISCONTINUED] metFORMIN (GLUCOPHAGE) 1000 MG tablet Take 1 tablet (1,000 mg total) by mouth 2 (two) times daily with a meal. (Patient not taking: Reported on 11/06/2021)   [DISCONTINUED] sitaGLIPtin (JANUVIA) 100 MG tablet Take 1 tablet (100 mg total) by mouth daily. Please schedule office visit before any future refills. (Patient  not taking: Reported on 11/06/2021)   No facility-administered medications prior to visit.    Review of Systems  Constitutional:  Positive for fatigue.  Eyes:  Negative for visual disturbance.  Respiratory: Negative.  Negative for cough and shortness of breath.   Cardiovascular:  Positive for leg swelling. Negative for chest pain and palpitations.  Gastrointestinal: Negative.  Negative for constipation, diarrhea, nausea and  vomiting.  Endocrine: Positive for polydipsia. Negative for cold intolerance, heat intolerance, polyphagia and polyuria.  Genitourinary:  Negative for dysuria, frequency, hematuria and urgency.  Neurological:  Negative for dizziness, syncope, speech difficulty, weakness, light-headedness, numbness and headaches.      Objective    BP 132/73 (BP Location: Right Arm, Patient Position: Sitting, Cuff Size: Large)    Pulse 78    Temp 97.9 F (36.6 C) (Oral)    Wt 189 lb (85.7 kg)    SpO2 99%    BMI 31.45 kg/m    Physical Exam Vitals reviewed.  Constitutional:      General: She is awake.     Appearance: Normal appearance. She is well-developed, well-groomed and overweight.  HENT:     Head: Normocephalic and atraumatic.  Eyes:     Conjunctiva/sclera: Conjunctivae normal.  Cardiovascular:     Rate and Rhythm: Normal rate and regular rhythm.     Pulses: Normal pulses.     Heart sounds: Normal heart sounds.  Pulmonary:     Effort: Pulmonary effort is normal.     Breath sounds: Normal breath sounds. No stridor. No wheezing, rhonchi or rales.  Abdominal:     General: Abdomen is flat.     Palpations: Abdomen is soft.  Musculoskeletal:     Right lower leg: No edema.     Left lower leg: No edema.  Neurological:     General: No focal deficit present.     Mental Status: She is lethargic.  Psychiatric:        Attention and Perception: Attention normal.        Behavior: Behavior normal. Behavior is cooperative.      Results for orders placed or performed in visit on 11/06/21  POCT glycosylated hemoglobin (Hb A1C)  Result Value Ref Range   Hemoglobin A1C 10.0 (A) 4.0 - 5.6 %    Assessment & Plan     Problem List Items Addressed This Visit       Cardiovascular and Mediastinum   Hypertension associated with diabetes ([place]) - Primary    Chronic and managed with amlodipine, Lisinopril  Reports peripheral swelling after work and with driving  BP 767/34 today in office Recommend  follow up in 3 months to discuss if adding HCTZ would be appropriate and acceptable to patient given her concerns of cramping  Refills provided for medications         Relevant Medications   dapagliflozin propanediol (FARXIGA) 10 MG TABS tablet   atorvastatin (LIPITOR) 20 MG tablet   amLODipine (NORVASC) 10 MG tablet   glipiZIDE (GLUCOTROL XL) 10 MG 24 hr tablet   lisinopril (ZESTRIL) 10 MG tablet   metFORMIN (GLUCOPHAGE) 1000 MG tablet   sitaGLIPtin (JANUVIA) 100 MG tablet   Other Relevant Orders   CBC w/Diff/Platelet     Endocrine   Type 2 diabetes mellitus with hyperosmolarity without coma ([place])    Chronic and currently uncontrolled  A1c 10.0% today - patient states she ran out of some of her medications several weeks ago Patient expresses concerns that current medications are not providing adequate  control  Recommend that we stabilize with current medications before making adjustments  Refills provided Labs ordered: CMP, CBC, lipid panel Follow up in 3 months       Relevant Medications   dapagliflozin propanediol (FARXIGA) 10 MG TABS tablet   atorvastatin (LIPITOR) 20 MG tablet   glipiZIDE (GLUCOTROL XL) 10 MG 24 hr tablet   lisinopril (ZESTRIL) 10 MG tablet   metFORMIN (GLUCOPHAGE) 1000 MG tablet   sitaGLIPtin (JANUVIA) 100 MG tablet   Other Relevant Orders   POCT glycosylated hemoglobin (Hb A1C) (Completed)   Comprehensive Metabolic Panel (CMET)   Lipid Profile     Other   Hyperlipidemia (Chronic)    Chronic, currently managed with lipitor Lipid panel ordered today for monitoring  Results to dictate further management Recommend lifestyle changes to assist with management Follow up in 3 months      Relevant Medications   atorvastatin (LIPITOR) 20 MG tablet   amLODipine (NORVASC) 10 MG tablet   lisinopril (ZESTRIL) 10 MG tablet   Other Relevant Orders   Lipid Profile     Return in about 3 months (around 02/04/2022) for Diabetes .   I, Ronin Crager E Damika Harmon,  PA-C, have reviewed all documentation for this visit. The documentation on 11/06/21 for the exam, diagnosis, procedures, and orders are all accurate and complete.   Kourtni Stineman, [place] MPH [facility]   Return in about 3 months (around 02/04/2022) for Diabetes .         Almon Register, PA-C  [employer] [phone] (phone) [phone] (fax)  [place]

## 2021-11-07 LAB — COMPREHENSIVE METABOLIC PANEL
ALT: 41 IU/L — ABNORMAL HIGH (ref 0–32)
AST: 26 IU/L (ref 0–40)
Albumin/Globulin Ratio: 1.7 (ref 1.2–2.2)
Albumin: 4.7 g/dL (ref 3.8–4.8)
Alkaline Phosphatase: 92 IU/L (ref 44–121)
BUN/Creatinine Ratio: 15 (ref 12–28)
BUN: 12 mg/dL (ref 8–27)
Bilirubin Total: 0.5 mg/dL (ref 0.0–1.2)
CO2: 24 mmol/L (ref 20–29)
Calcium: 10.2 mg/dL (ref 8.7–10.3)
Chloride: 98 mmol/L (ref 96–106)
Creatinine, Ser: 0.81 mg/dL (ref 0.57–1.00)
Globulin, Total: 2.7 g/dL (ref 1.5–4.5)
Glucose: 222 mg/dL — ABNORMAL HIGH (ref 70–99)
Potassium: 4.4 mmol/L (ref 3.5–5.2)
Sodium: 138 mmol/L (ref 134–144)
Total Protein: 7.4 g/dL (ref 6.0–8.5)
eGFR: 83 mL/min/{1.73_m2} (ref >59)

## 2021-11-07 LAB — CBC WITH DIFFERENTIAL/PLATELET
Basophils Absolute: 0.1 10*3/uL (ref 0.0–0.2)
Basos: 1 %
EOS (ABSOLUTE): 0.4 10*3/uL (ref 0.0–0.4)
Eos: 4 %
Hematocrit: 49.2 % — ABNORMAL HIGH (ref 34.0–46.6)
Hemoglobin: 16.4 g/dL — ABNORMAL HIGH (ref 11.1–15.9)
Immature Grans (Abs): 0 10*3/uL (ref 0.0–0.1)
Immature Granulocytes: 0 %
Lymphocytes Absolute: 3.7 10*3/uL — ABNORMAL HIGH (ref 0.7–3.1)
Lymphs: 34 %
MCH: 28.8 pg (ref 26.6–33.0)
MCHC: 33.3 g/dL (ref 31.5–35.7)
MCV: 87 fL (ref 79–97)
Monocytes Absolute: 0.7 10*3/uL (ref 0.1–0.9)
Monocytes: 6 %
Neutrophils Absolute: 6 10*3/uL (ref 1.4–7.0)
Neutrophils: 55 %
Platelets: 278 10*3/uL (ref 150–450)
RBC: 5.69 x10E6/uL — ABNORMAL HIGH (ref 3.77–5.28)
RDW: 13 % (ref 11.7–15.4)
WBC: 10.9 10*3/uL — ABNORMAL HIGH (ref 3.4–10.8)

## 2021-11-07 LAB — LIPID PANEL
Chol/HDL Ratio: 4.2 ratio (ref 0.0–4.4)
Cholesterol, Total: 172 mg/dL (ref 100–199)
HDL: 41 mg/dL (ref >39)
LDL Chol Calc (NIH): 85 mg/dL (ref 0–99)
Triglycerides: 280 mg/dL — ABNORMAL HIGH (ref 0–149)
VLDL Cholesterol Cal: 46 mg/dL — ABNORMAL HIGH (ref 5–40)
